# Patient Record
Sex: Male | Born: 1985 | Race: White | Hispanic: No | Marital: Married | Smoking: Never smoker
Health system: Southern US, Community
[De-identification: ages and names within clinical notes are randomized; demographics above are authoritative.]

## PROBLEM LIST (undated history)

## (undated) DIAGNOSIS — F909 Attention-deficit hyperactivity disorder, unspecified type: Secondary | ICD-10-CM

## (undated) DIAGNOSIS — N529 Male erectile dysfunction, unspecified: Secondary | ICD-10-CM

## (undated) DIAGNOSIS — N200 Calculus of kidney: Secondary | ICD-10-CM

## (undated) HISTORY — DX: Male erectile dysfunction, unspecified: N52.9

## (undated) HISTORY — DX: Attention-deficit hyperactivity disorder, unspecified type: F90.9

## (undated) HISTORY — DX: Calculus of kidney: N20.0

## (undated) HISTORY — PX: OTHER SURGICAL HISTORY: SHX169

---

## 2019-06-08 ENCOUNTER — Other Ambulatory Visit: Payer: Self-pay

## 2019-06-08 ENCOUNTER — Ambulatory Visit: Payer: Managed Care, Other (non HMO) | Admitting: Urology

## 2019-06-08 ENCOUNTER — Encounter: Payer: Self-pay | Admitting: Urology

## 2019-06-08 VITALS — BP 157/104 | HR 120 | Ht 74.0 in | Wt 177.0 lb

## 2019-06-08 DIAGNOSIS — Z3009 Encounter for other general counseling and advice on contraception: Secondary | ICD-10-CM

## 2019-06-08 MED ORDER — DIAZEPAM 10 MG PO TABS: 10.0000 mg | ORAL_TABLET | Freq: Once | ORAL | 0 refills | Status: AC

## 2019-06-08 NOTE — Progress Notes (Signed)
06/08/2019 1:55 PM   Kenneth Rocha [birth year] [id number]  Referring provider: No referring provider defined for this encounter.  Chief Complaint  Patient presents with  . VAS Consult    HPI: 33 y.o. year old male referred for further evaluation of possible vasectomy.  He and his wife have one biological child.  His wife had severe post partum depression following the birth of their first child.  They both agree that vasectomy is the best options for family planning.  He denies a history of testicular trauma or pain.  No urinary issues.  No previous scrotal surgeries.   PMH: Past Medical History:  Diagnosis Date  . ADHD   . ED (erectile dysfunction)     Surgical History: Past Surgical History:  Procedure Laterality Date  . none      Home Medications:  Allergies as of 06/08/2019   No Known Allergies     Medication List       Accurate as of June 08, 2019  1:55 PM. If you have any questions, ask your nurse or doctor.        amphetamine-dextroamphetamine 20 MG tablet Commonly known as: ADDERALL Take 20 mg by mouth 2 (two) times daily.   amphetamine-dextroamphetamine 25 MG 24 hr capsule Commonly known as: ADDERALL XR Take by mouth daily.   diazepam 10 MG tablet Commonly known as: Valium Take 1 tablet (10 mg total) by mouth once for 1 dose. Take 1 hour prior to procedure. Please have a driver available. Started by: Vanna Scotland, MD   sildenafil 20 MG tablet Commonly known as: REVATIO PLEASE SEE ATTACHED FOR DETAILED DIRECTIONS       Allergies: No Known Allergies  Family History: Family History  Problem Relation Age of Onset  . Prostate cancer Neg Hx   . Kidney cancer Neg Hx     Social History:  reports that he has never smoked. He has never used smokeless tobacco. He reports current alcohol use. No history on file for drug.  ROS: UROLOGY Frequent Urination?: No Hard to postpone urination?: No Burning/pain with urination?: No Get up at  night to urinate?: No Leakage of urine?: No Urine stream starts and stops?: No Trouble starting stream?: No Do you have to strain to urinate?: No Blood in urine?: No Urinary tract infection?: No Sexually transmitted disease?: No Injury to kidneys or bladder?: No Painful intercourse?: No Weak stream?: No Erection problems?: Yes Penile pain?: No  Gastrointestinal Nausea?: No Vomiting?: No Indigestion/heartburn?: No Diarrhea?: No Constipation?: No  Constitutional Fever: No Night sweats?: No Weight loss?: No Fatigue?: No  Skin Skin rash/lesions?: No Itching?: No  Eyes Blurred vision?: No Double vision?: No  Ears/Nose/Throat Sore throat?: No Sinus problems?: No  Hematologic/Lymphatic Swollen glands?: No Easy bruising?: No  Cardiovascular Leg swelling?: No Chest pain?: No  Respiratory Cough?: No Shortness of breath?: No  Endocrine Excessive thirst?: No  Musculoskeletal Back pain?: No Joint pain?: No  Neurological Headaches?: No Dizziness?: No  Psychologic Depression?: No Anxiety?: No  Physical Exam: BP (!) 157/104   Pulse (!) 120   Ht 6\' 2"  (1.88 m)   Wt 177 lb (80.3 kg)   BMI 22.73 kg/m   Constitutional:  Alert and oriented, No acute distress. HEENT: [place] AT, moist mucus membranes.  Trachea midline, no masses. Cardiovascular: No clubbing, cyanosis, or edema. Respiratory: Normal respiratory effort, no increased work of breathing. GI: Abdomen is soft, nontender, nondistended, no abdominal masses GU: Normal phallus.  Bilateral descended testicles without masses.  Vasa easily palpable bilaterally. Skin: No rashes, bruises or suspicious lesions. Neurologic: Grossly intact, no focal deficits, moving all 4 extremities. Psychiatric: Normal mood and affect.  Laboratory Data: N/a  Urinalysis n/a  Pertinent Imaging: N/a  Assessment & Plan:    1. Vasectomy evaluation Today, we discussed what the vas deferens is, where it is located, and its  function. We reviewed the procedure for vasectomy, it's risks, benefits, alternatives, and likelihood of achieving his goals. We discussed in detail the procedure, complications, and recovery as well as the need for clearance prior to unprotected intercourse. We discussed that vasectomy does not protect against sexually transmitted diseases. We discussed that this procedure does not result in immediate sterility and that they would need to use other forms of birth control until he has been cleared with negative postvasectomy semen analyses. I explained that the procedure is considered to be permanent and that attempts at reversal have varying degrees of success. These options include vasectomy reversal, sperm retrieval, and in vitro fertilization; these can be very expensive. We discussed the chance of postvasectomy pain syndrome which occurs in less than 5% of patients. I explained to the patient that there is no treatment to resolve this chronic pain, and that if it developed I would not be able to help resolve the issue, but that surgery is generally not needed for correction. I explained there have even been reports of systemic like illness associated with this chronic pain, and that there was no good cure. I explained that vasectomy it is not a 100% reliable form of birth control, and the risk of pregnancy after vasectomy is approximately 1 in 2000 men who had a negative postvasectomy semen analysis or rare non-motile sperm. I explained that repeat vasectomy was necessary in less than 1% of vasectomy procedures when employing the type of technique that I use. I explained that he should refrain from ejaculation for approximately one week following vasectomy. I explained that there are other options for birth control which are permanent and non-permanent; we discussed these. I explained the rates of surgical complications, such as symptomatic hematoma or infection, are low (1-2%) and vary with the surgeon's  experience and criteria used to diagnose the complication.   The patient had the opportunity to ask questions to his stated satisfaction. He voiced understanding of the above factors and stated that he has read all the information provided to him and the packets and informed consent.  He is interested in receiving of Valium 10 mg prior to the procedure for the purpose of anxiolysis.  A prescription was given today.  He will have a driver on the day of the procedure.   Hollice Espy, MD  [facility] [address] [place], [place] [address] [phone]

## 2019-06-08 NOTE — Patient Instructions (Signed)

## 2019-07-07 ENCOUNTER — Ambulatory Visit: Payer: Managed Care, Other (non HMO) | Admitting: Urology

## 2019-07-07 ENCOUNTER — Encounter: Payer: Self-pay | Admitting: Urology

## 2019-07-07 ENCOUNTER — Other Ambulatory Visit: Payer: Self-pay

## 2019-07-07 ENCOUNTER — Other Ambulatory Visit: Payer: Self-pay | Admitting: Urology

## 2019-07-07 VITALS — BP 150/80 | HR 106 | Ht 69.0 in | Wt 174.0 lb

## 2019-07-07 DIAGNOSIS — Z302 Encounter for sterilization: Secondary | ICD-10-CM | POA: Diagnosis not present

## 2019-07-07 MED ORDER — HYDROCODONE-ACETAMINOPHEN 5-325 MG PO TABS: 1.0000 | ORAL_TABLET | ORAL | 0 refills | Status: DC | PRN

## 2019-07-07 NOTE — Patient Instructions (Signed)

## 2019-07-07 NOTE — Progress Notes (Signed)
Vasectomy Procedure Note  Indications: The patient is a 33 y.o. male who presents today for elective sterilization.  He has been consented for the procedure.  He is aware of the risks and benefits.  He had no additional questions.  He agrees to proceed.  He denies any other significant change since his last visit.  Pre-operative Diagnosis: Elective sterilization  Post-operative Diagnosis: Elective sterilization  Premedication: Valium 10 mg po  Surgeon: Scott C. Stoioff, M.D  Description: The patient was prepped and draped in the standard fashion.  The right vas deferens was identified and brought superiorly to the anterior scrotal skin.  The skin and vas was then anesthetized utilizing 5 ml 1% lidocaine.  A small stab incision was made and spread with the vas dissector.  The vas was grasped utilizing the vas clamp and elevated out of the incision.  The vas was dissected free from surrounding tissue and vessels and an ~1 cm segment was excised.  The vas lumens were cauterized utilizing electrocautery.  The distal segment was buried in the surrounding sheath with a 3-0 chromic suture.  No significant bleeding was observed.  The vas ends were then dropped back into the hemiscrotum.  The skin was closed with hemostatic pressure.  An identical procedure was performed on the contralateral side.  Clean dry gauze was applied to the incision sites.  The patient tolerated the procedure well.  Complications:None  Recommendations: 1.  No lifting greater than 10 pounds or strenuousactivity for 1 week. 2.  Scrotal support for 1 week. 3.  Shower only for 1 week; may shower in the morning 4.  May resume intercourse in one week if no significant discomfort.  Continue alternate contraception for 12 weeks.  5.  Call for significant pain, swelling, redness, drainage or fever greater than 100.5. 6.  Rx hydrocodone/APAP 5/325 1-2 every 6 hours as needed for pain. 7.  Follow-up semen analysis in 12  weeks.   Scott Stoioff, MD  

## 2019-07-08 ENCOUNTER — Encounter: Payer: Self-pay | Admitting: Urology

## 2019-07-11 MED ORDER — HYDROCODONE-ACETAMINOPHEN 5-325 MG PO TABS: 1.0000 | ORAL_TABLET | ORAL | 0 refills | Status: DC | PRN

## 2019-09-11 ENCOUNTER — Encounter: Payer: Self-pay | Admitting: Urology

## 2019-09-13 NOTE — Telephone Encounter (Signed)
Called patient to discuss symptoms. Patient states pain has improved, concerned about the vein swelling. Offered a same day appointment, declined first available for his schedule is 09/15/19. Patient aware if symptoms worsen, seek medical attention. Redness, swelling, pain. Patient voiced understanding.

## 2019-09-15 ENCOUNTER — Ambulatory Visit (INDEPENDENT_AMBULATORY_CARE_PROVIDER_SITE_OTHER): Payer: Managed Care, Other (non HMO) | Admitting: Physician Assistant

## 2019-09-15 ENCOUNTER — Encounter: Payer: Self-pay | Admitting: Physician Assistant

## 2019-09-15 ENCOUNTER — Other Ambulatory Visit: Payer: Self-pay

## 2019-09-15 VITALS — BP 146/102 | HR 84 | Ht 69.0 in | Wt 184.0 lb

## 2019-09-15 DIAGNOSIS — I808 Phlebitis and thrombophlebitis of other sites: Secondary | ICD-10-CM

## 2019-09-15 NOTE — Progress Notes (Signed)
09/15/2019 2:38 PM   Kenneth Rocha 11/27/1985 [id number]  CC: Penile induration  HPI: Kenneth Rocha is a 34 y.o. male who presents today for evaluation of penile induration.  He underwent vasectomy with Dr. Bernardo Heater on 07/07/2019 and takes sildenafil for ED.  Today, he reports an approximate 10-day history of a "hardened vein" spanning the length of his penis.  He originally noted an indurated region approximately 2 cm in diameter at the base of his penis that progressively spread distally to the coronal ridge and followed a presumed vascular pattern.    The induration persists regardless of tumescence.  It is mildly tender to palpation; tenderness is described as stinging and 3/10 in severity.  Nothing like this is ever happened to him before.  He denies a history of coagulopathy.  He has not taken any medication at home for symptom management.  He denies fever, chills, nausea, vomiting, penile discharge, dysuria, and gross hematuria.  He notes that this acute change has not affected the quality of his erections.  He denies painful erections or penile curvature.  PMH: Past Medical History:  Diagnosis Date  . ADHD   . ED (erectile dysfunction)     Surgical History: Past Surgical History:  Procedure Laterality Date  . none      Home Medications:  Allergies as of 09/15/2019      Reactions   Sulfa Antibiotics Hives      Medication List       Accurate as of September 15, 2019  2:38 PM. If you have any questions, ask your nurse or doctor.        STOP taking these medications   HYDROcodone-acetaminophen 5-325 MG tablet Commonly known as: NORCO/VICODIN Stopped by: Debroah Loop, PA-C   sildenafil 20 MG tablet Commonly known as: REVATIO Stopped by: Debroah Loop, PA-C     TAKE these medications   amphetamine-dextroamphetamine 20 MG tablet Commonly known as: ADDERALL Take 20 mg by mouth daily. What changed: Another medication with the same name was  removed. Continue taking this medication, and follow the directions you see here. Changed by: Debroah Loop, PA-C   sildenafil 50 MG tablet Commonly known as: VIAGRA Take 50 mg by mouth daily as needed for erectile dysfunction.       Allergies:  Allergies  Allergen Reactions  . Sulfa Antibiotics Hives    Family History: Family History  Problem Relation Age of Onset  . Prostate cancer Neg Hx   . Kidney cancer Neg Hx     Social History:   reports that he has never smoked. He has never used smokeless tobacco. He reports current alcohol use. No history on file for drug.  Physical Exam: BP (!) 146/102   Pulse 84   Ht 5\' 9"  (1.753 m)   Wt 184 lb (83.5 kg)   BMI 27.17 kg/m   Constitutional:  Alert and oriented, no acute distress, nontoxic appearing HEENT: [place], AT Cardiovascular: No clubbing, cyanosis, or edema Respiratory: Normal respiratory effort, no increased work of breathing GU: Bilateral descended testicles.  Palpable, discrete vascular induration spanning the right ventral aspect of the penis from the base to the mid shaft without erythema or edema.  No penile discharge. 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  Recommend supportive care with ibuprofen 400 mg every 6 to 8 hours as needed.  Explained to the patient that it may take time for the blood clot to resolve and that some degree of induration may be permanent.  Counseled patient to contact our office if he develops acute onset redness, swelling, or pain.  He expressed understanding.  Return if symptoms worsen or fail to improve.  Carman Ching, PA-C  [facility] [address], [address] [place], [place] [address] [phone]

## 2019-09-15 NOTE — Patient Instructions (Addendum)
Take ibuprofen 400mg  every 6-8 hours as needed for symptom palliation. Contact our office if you develop redness, swelling, or pain.

## 2019-09-27 ENCOUNTER — Other Ambulatory Visit: Payer: Self-pay | Admitting: Physician Assistant

## 2019-09-27 DIAGNOSIS — R809 Proteinuria, unspecified: Secondary | ICD-10-CM

## 2019-10-05 ENCOUNTER — Other Ambulatory Visit: Payer: Self-pay

## 2019-10-05 ENCOUNTER — Other Ambulatory Visit: Payer: Managed Care, Other (non HMO)

## 2019-10-05 DIAGNOSIS — Z302 Encounter for sterilization: Secondary | ICD-10-CM

## 2019-10-06 LAB — POST-VAS SPERM EVALUATION,QUAL: Volume: 3 mL

## 2019-10-07 ENCOUNTER — Telehealth: Payer: Self-pay | Admitting: *Deleted

## 2019-10-07 NOTE — Telephone Encounter (Signed)
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  Okay to use vasectomy as primary contraception

## 2019-10-07 NOTE — Telephone Encounter (Signed)
Patient notified on voicemail per [facility] and mychart notification sent

## 2020-08-09 ENCOUNTER — Ambulatory Visit: Payer: Managed Care, Other (non HMO) | Admitting: Urology

## 2020-08-09 ENCOUNTER — Other Ambulatory Visit: Payer: Self-pay | Admitting: *Deleted

## 2020-08-09 ENCOUNTER — Ambulatory Visit
Admission: RE | Admit: 2020-08-09 | Discharge: 2020-08-09 | Disposition: A | Discharge status: Home / Self Care | Payer: Managed Care, Other (non HMO) | Source: Ambulatory Visit | Attending: Urology | Admitting: Urology

## 2020-08-09 ENCOUNTER — Other Ambulatory Visit: Payer: Self-pay

## 2020-08-09 ENCOUNTER — Encounter: Payer: Self-pay | Admitting: Urology

## 2020-08-09 ENCOUNTER — Ambulatory Visit
Admission: RE | Admit: 2020-08-09 | Discharge: 2020-08-09 | Disposition: A | Discharge status: Home / Self Care | Payer: Managed Care, Other (non HMO) | Attending: Urology | Admitting: Urology

## 2020-08-09 VITALS — BP 137/95 | HR 99 | Ht 74.0 in | Wt 180.0 lb

## 2020-08-09 DIAGNOSIS — R1032 Left lower quadrant pain: Secondary | ICD-10-CM

## 2020-08-09 DIAGNOSIS — N200 Calculus of kidney: Secondary | ICD-10-CM

## 2020-08-09 DIAGNOSIS — N132 Hydronephrosis with renal and ureteral calculous obstruction: Secondary | ICD-10-CM | POA: Diagnosis not present

## 2020-08-09 DIAGNOSIS — N23 Unspecified renal colic: Secondary | ICD-10-CM | POA: Diagnosis not present

## 2020-08-09 DIAGNOSIS — N201 Calculus of ureter: Secondary | ICD-10-CM

## 2020-08-09 LAB — MICROSCOPIC EXAMINATION
Bacteria, UA: NONE SEEN
Epithelial Cells (non renal): NONE SEEN /hpf (ref 0–10)

## 2020-08-09 LAB — URINALYSIS, COMPLETE
Bilirubin, UA: NEGATIVE
Glucose, UA: NEGATIVE
Ketones, UA: NEGATIVE
Leukocytes,UA: NEGATIVE
Nitrite, UA: NEGATIVE
Protein,UA: NEGATIVE
Specific Gravity, UA: 1.01 (ref 1.005–1.030)
Urobilinogen, Ur: 0.2 mg/dL (ref 0.2–1.0)
pH, UA: 6.5 (ref 5.0–7.5)

## 2020-08-09 NOTE — H&P (View-Only) (Signed)
08/09/2020 10:38 AM   Kenneth Rocha 1986/05/05 [id number]  Referring provider: No referring provider defined for this encounter.  Chief Complaint  Patient presents with  . Nephrolithiasis    HPI: 35 y.o. male presents for urgent visit for a possible kidney stone.   2-3 week history low back pain worse the last 3-4 days  Last night after heavy glass of wine he had onset of severe left upper quadrant pain which radiated to the left groin region  Associated with nausea/vomiting  Denies fever/chills  Pain much better this morning but located in the left flank and radiating to the left lower quadrant, left upper quadrant and groin region  No bothersome LUTS  Denies gross hematuria  Prior stone ~2012 which she passed  KUB performed this morning was reviewed; large calcifications between L4-5 transverse processes on the left.  Multiple pelvic phleboliths   PMH: Past Medical History:  Diagnosis Date  . ADHD   . ED (erectile dysfunction)     Surgical History: Past Surgical History:  Procedure Laterality Date  . none      Home Medications:  Allergies as of 08/09/2020      Reactions   Sulfa Antibiotics Hives      Medication List       Accurate as of August 09, 2020 10:38 AM. If you have any questions, ask your nurse or doctor.        amphetamine-dextroamphetamine 20 MG tablet Commonly known as: ADDERALL Take 20 mg by mouth daily.   sildenafil 50 MG tablet Commonly known as: VIAGRA Take 50 mg by mouth daily as needed for erectile dysfunction.       Allergies:  Allergies  Allergen Reactions  . Sulfa Antibiotics Hives    Family History: Family History  Problem Relation Age of Onset  . Prostate cancer Neg Hx   . Kidney cancer Neg Hx     Social History:  reports that he has never smoked. He has never used smokeless tobacco. He reports current alcohol use. No history on file for drug use.   Physical Exam: There were no vitals taken for this  visit.  Constitutional:  Alert and oriented, No acute distress. HEENT:  AT, moist mucus membranes.  Trachea midline, no masses. Cardiovascular: No clubbing, cyanosis, or edema. Respiratory: Normal respiratory effort, no increased work of breathing. Skin: No rashes, bruises or suspicious lesions. Neurologic: Grossly intact, no focal deficits, moving all 4 extremities. Psychiatric: Normal mood and affect.  Laboratory Data:   Urinalysis Micro 3-10 RBCs/0 WBC   Assessment & Plan:    1.  Left flank/abdominal pain  Pain severe last night and most likely secondary to ureteral calculus  Stone protocol CT ordered  He will be notified with results and discussion of treatment options  Addendum: I contacted patient this morning regarding his CT imaging.  He does have two adjacent calcifications measuring 5 x 7 mm.  Variable HU between [id number].  Mild to moderate left hydronephrosis with upstream hydroureter  I discussed unlikely he will pass these calculi and do not feel a trial of passage would be beneficial.  Both SWL and ureteroscopy were discussed as options.  SWL has a lower stone free rate in a single procedure, but also a lower complication rate compared to ureteroscopy and avoids a stent and associated stent related symptoms. Possible complications include incomplete fragmentation, obstructing stone fragments, and need for additional treatment.  Ureteroscopy with laser lithotripsy and stent placement has a higher stone free rate than  SWL in a single procedure, however increased complication rate including possible infection, ureteral injury, bleeding, and stent related morbidity. Common stent related symptoms include dysuria, urgency/frequency, and flank pain.  His pain has been intermittently severe and he was concerned about continued pain while trying to pass stone fragments and has opted for ureteroscopy.  We discussed availability with Dr. Richardo Hanks tomorrow and Dr. Apolinar Junes on  Monday and he requested to be scheduled Monday.  Rx oxycodone/APAP sent to pharmacy    Riki Altes, MD  [facility] [address] [place], [place] [address] [phone]

## 2020-08-09 NOTE — Progress Notes (Signed)
08/09/2020 10:38 AM   Kenneth Rocha 1986/05/05 [id number]  Referring provider: No referring provider defined for this encounter.  Chief Complaint  Patient presents with  . Nephrolithiasis    HPI: 35 y.o. male presents for urgent visit for a possible kidney stone.   2-3 week history low back pain worse the last 3-4 days  Last night after heavy glass of wine he had onset of severe left upper quadrant pain which radiated to the left groin region  Associated with nausea/vomiting  Denies fever/chills  Pain much better this morning but located in the left flank and radiating to the left lower quadrant, left upper quadrant and groin region  No bothersome LUTS  Denies gross hematuria  Prior stone ~2012 which she passed  KUB performed this morning was reviewed; large calcifications between L4-5 transverse processes on the left.  Multiple pelvic phleboliths   PMH: Past Medical History:  Diagnosis Date  . ADHD   . ED (erectile dysfunction)     Surgical History: Past Surgical History:  Procedure Laterality Date  . none      Home Medications:  Allergies as of 08/09/2020      Reactions   Sulfa Antibiotics Hives      Medication List       Accurate as of August 09, 2020 10:38 AM. If you have any questions, ask your nurse or doctor.        amphetamine-dextroamphetamine 20 MG tablet Commonly known as: ADDERALL Take 20 mg by mouth daily.   sildenafil 50 MG tablet Commonly known as: VIAGRA Take 50 mg by mouth daily as needed for erectile dysfunction.       Allergies:  Allergies  Allergen Reactions  . Sulfa Antibiotics Hives    Family History: Family History  Problem Relation Age of Onset  . Prostate cancer Neg Hx   . Kidney cancer Neg Hx     Social History:  reports that he has never smoked. He has never used smokeless tobacco. He reports current alcohol use. No history on file for drug use.   Physical Exam: There were no vitals taken for this  visit.  Constitutional:  Alert and oriented, No acute distress. HEENT:  AT, moist mucus membranes.  Trachea midline, no masses. Cardiovascular: No clubbing, cyanosis, or edema. Respiratory: Normal respiratory effort, no increased work of breathing. Skin: No rashes, bruises or suspicious lesions. Neurologic: Grossly intact, no focal deficits, moving all 4 extremities. Psychiatric: Normal mood and affect.  Laboratory Data:   Urinalysis Micro 3-10 RBCs/0 WBC   Assessment & Plan:    1.  Left flank/abdominal pain  Pain severe last night and most likely secondary to ureteral calculus  Stone protocol CT ordered  He will be notified with results and discussion of treatment options  Addendum: I contacted patient this morning regarding his CT imaging.  He does have two adjacent calcifications measuring 5 x 7 mm.  Variable HU between [id number].  Mild to moderate left hydronephrosis with upstream hydroureter  I discussed unlikely he will pass these calculi and do not feel a trial of passage would be beneficial.  Both SWL and ureteroscopy were discussed as options.  SWL has a lower stone free rate in a single procedure, but also a lower complication rate compared to ureteroscopy and avoids a stent and associated stent related symptoms. Possible complications include incomplete fragmentation, obstructing stone fragments, and need for additional treatment.  Ureteroscopy with laser lithotripsy and stent placement has a higher stone free rate than  SWL in a single procedure, however increased complication rate including possible infection, ureteral injury, bleeding, and stent related morbidity. Common stent related symptoms include dysuria, urgency/frequency, and flank pain.  His pain has been intermittently severe and he was concerned about continued pain while trying to pass stone fragments and has opted for ureteroscopy.  We discussed availability with Dr. Sninsky tomorrow and Dr. Brandon on  Monday and he requested to be scheduled Monday.  Rx oxycodone/APAP sent to pharmacy    Tyna Huertas C Maher Shon, MD  [facility] [address], [address] , [place] [address] [phone]  

## 2020-08-10 ENCOUNTER — Encounter: Payer: Self-pay | Admitting: Urology

## 2020-08-10 MED ORDER — OXYCODONE-ACETAMINOPHEN 5-325 MG PO TABS: 1.0000 | ORAL_TABLET | Freq: Four times a day (QID) | ORAL | 0 refills | Status: DC | PRN

## 2020-08-11 ENCOUNTER — Other Ambulatory Visit: Payer: Self-pay

## 2020-08-11 ENCOUNTER — Other Ambulatory Visit: Payer: Managed Care, Other (non HMO) | Admitting: Radiology

## 2020-08-11 ENCOUNTER — Other Ambulatory Visit
Admission: RE | Admit: 2020-08-11 | Discharge: 2020-08-11 | Disposition: A | Discharge status: Home / Self Care | Payer: Managed Care, Other (non HMO) | Source: Ambulatory Visit | Attending: Urology | Admitting: Urology

## 2020-08-11 DIAGNOSIS — Z20822 Contact with and (suspected) exposure to covid-19: Secondary | ICD-10-CM | POA: Insufficient documentation

## 2020-08-11 DIAGNOSIS — Z01812 Encounter for preprocedural laboratory examination: Secondary | ICD-10-CM | POA: Diagnosis not present

## 2020-08-11 DIAGNOSIS — N201 Calculus of ureter: Secondary | ICD-10-CM

## 2020-08-12 LAB — SARS CORONAVIRUS 2 (TAT 6-24 HRS): SARS Coronavirus 2: NEGATIVE

## 2020-08-14 ENCOUNTER — Ambulatory Visit: Payer: Managed Care, Other (non HMO) | Admitting: Anesthesiology

## 2020-08-14 ENCOUNTER — Encounter
Admission: RE | Disposition: A | Discharge status: Home / Self Care | Payer: Self-pay | Source: Home / Self Care | Attending: Urology

## 2020-08-14 ENCOUNTER — Ambulatory Visit: Payer: Managed Care, Other (non HMO)

## 2020-08-14 ENCOUNTER — Other Ambulatory Visit: Payer: Self-pay

## 2020-08-14 ENCOUNTER — Ambulatory Visit
Admission: RE | Admit: 2020-08-14 | Discharge: 2020-08-14 | Disposition: A | Discharge status: Home / Self Care | Payer: Managed Care, Other (non HMO) | Attending: Urology | Admitting: Urology

## 2020-08-14 ENCOUNTER — Encounter: Payer: Self-pay | Admitting: Urology

## 2020-08-14 DIAGNOSIS — N201 Calculus of ureter: Secondary | ICD-10-CM | POA: Diagnosis not present

## 2020-08-14 DIAGNOSIS — Z882 Allergy status to sulfonamides status: Secondary | ICD-10-CM | POA: Insufficient documentation

## 2020-08-14 DIAGNOSIS — N132 Hydronephrosis with renal and ureteral calculous obstruction: Secondary | ICD-10-CM | POA: Insufficient documentation

## 2020-08-14 DIAGNOSIS — Z79899 Other long term (current) drug therapy: Secondary | ICD-10-CM | POA: Insufficient documentation

## 2020-08-14 HISTORY — PX: CYSTOSCOPY/URETEROSCOPY/HOLMIUM LASER/STENT PLACEMENT: SHX6546

## 2020-08-14 SURGERY — CYSTOSCOPY/URETEROSCOPY/HOLMIUM LASER/STENT PLACEMENT
Anesthesia: General | Site: Ureter | Laterality: Left

## 2020-08-14 MED ORDER — PROPOFOL 10 MG/ML IV BOLUS
INTRAVENOUS | Status: AC
  Filled 2020-08-14: qty 40

## 2020-08-14 MED ORDER — ACETAMINOPHEN 10 MG/ML IV SOLN
INTRAVENOUS | Status: DC | PRN
  Administered 2020-08-14: 1000 mg via INTRAVENOUS

## 2020-08-14 MED ORDER — IOPAMIDOL (ISOVUE-200) INJECTION 41%
INTRAVENOUS | Status: DC | PRN
  Administered 2020-08-14: 15 mL

## 2020-08-14 MED ORDER — FENTANYL CITRATE (PF) 100 MCG/2ML IJ SOLN
INTRAMUSCULAR | Status: DC | PRN
  Administered 2020-08-14 (×2): 50 ug via INTRAVENOUS

## 2020-08-14 MED ORDER — FENTANYL CITRATE (PF) 100 MCG/2ML IJ SOLN
INTRAMUSCULAR | Status: AC
  Filled 2020-08-14: qty 2

## 2020-08-14 MED ORDER — CHLORHEXIDINE GLUCONATE 0.12 % MT SOLN: 15.0000 mL | Freq: Once | OROMUCOSAL | Status: AC

## 2020-08-14 MED ORDER — ONDANSETRON HCL 4 MG/2ML IJ SOLN
INTRAMUSCULAR | Status: DC | PRN
  Administered 2020-08-14: 4 mg via INTRAVENOUS

## 2020-08-14 MED ORDER — ONDANSETRON HCL 4 MG/2ML IJ SOLN
INTRAMUSCULAR | Status: AC
  Filled 2020-08-14: qty 2

## 2020-08-14 MED ORDER — MIDAZOLAM HCL 2 MG/2ML IJ SOLN
INTRAMUSCULAR | Status: DC | PRN
  Administered 2020-08-14: 2 mg via INTRAVENOUS

## 2020-08-14 MED ORDER — LIDOCAINE HCL (CARDIAC) PF 100 MG/5ML IV SOSY
PREFILLED_SYRINGE | INTRAVENOUS | Status: DC | PRN
  Administered 2020-08-14: 80 mg via INTRAVENOUS

## 2020-08-14 MED ORDER — LACTATED RINGERS IV SOLN: INTRAVENOUS | Status: DC

## 2020-08-14 MED ORDER — TAMSULOSIN HCL 0.4 MG PO CAPS: 0.4000 mg | ORAL_CAPSULE | Freq: Every day | ORAL | 0 refills | Status: DC

## 2020-08-14 MED ORDER — ACETAMINOPHEN 10 MG/ML IV SOLN
INTRAVENOUS | Status: AC
  Filled 2020-08-14: qty 100

## 2020-08-14 MED ORDER — ONDANSETRON HCL 4 MG/2ML IJ SOLN
4.0000 mg | Freq: Once | INTRAMUSCULAR | Status: AC | PRN
  Administered 2020-08-14: 4 mg via INTRAVENOUS

## 2020-08-14 MED ORDER — CEFAZOLIN SODIUM-DEXTROSE 2-4 GM/100ML-% IV SOLN
INTRAVENOUS | Status: AC
  Filled 2020-08-14: qty 100

## 2020-08-14 MED ORDER — FENTANYL CITRATE (PF) 100 MCG/2ML IJ SOLN
INTRAMUSCULAR | Status: AC
  Administered 2020-08-14: 25 ug via INTRAVENOUS
  Filled 2020-08-14: qty 2

## 2020-08-14 MED ORDER — CEFAZOLIN SODIUM-DEXTROSE 2-4 GM/100ML-% IV SOLN
2.0000 g | INTRAVENOUS | Status: AC
  Administered 2020-08-14: 2 g via INTRAVENOUS

## 2020-08-14 MED ORDER — ACETAMINOPHEN 10 MG/ML IV SOLN: 1000.0000 mg | Freq: Once | INTRAVENOUS | Status: DC | PRN

## 2020-08-14 MED ORDER — FAMOTIDINE 20 MG PO TABS
ORAL_TABLET | ORAL | Status: AC
  Administered 2020-08-14: 20 mg
  Filled 2020-08-14: qty 1

## 2020-08-14 MED ORDER — PROPOFOL 10 MG/ML IV BOLUS
INTRAVENOUS | Status: DC | PRN
  Administered 2020-08-14: 200 mg via INTRAVENOUS

## 2020-08-14 MED ORDER — ORAL CARE MOUTH RINSE: 15.0000 mL | Freq: Once | OROMUCOSAL | Status: AC

## 2020-08-14 MED ORDER — OXYCODONE HCL 5 MG PO TABS
ORAL_TABLET | ORAL | Status: AC
  Administered 2020-08-14: 5 mg via ORAL
  Filled 2020-08-14: qty 1

## 2020-08-14 MED ORDER — OXYCODONE HCL 5 MG/5ML PO SOLN: 5.0000 mg | Freq: Once | ORAL | Status: AC | PRN

## 2020-08-14 MED ORDER — DEXAMETHASONE SODIUM PHOSPHATE 10 MG/ML IJ SOLN
INTRAMUSCULAR | Status: DC | PRN
  Administered 2020-08-14: 5 mg via INTRAVENOUS

## 2020-08-14 MED ORDER — FENTANYL CITRATE (PF) 100 MCG/2ML IJ SOLN
25.0000 ug | INTRAMUSCULAR | Status: AC | PRN
  Administered 2020-08-14 (×4): 25 ug via INTRAVENOUS

## 2020-08-14 MED ORDER — OXYCODONE HCL 5 MG PO TABS: 5.0000 mg | ORAL_TABLET | Freq: Once | ORAL | Status: AC | PRN

## 2020-08-14 MED ORDER — OXYBUTYNIN CHLORIDE 5 MG PO TABS: 5.0000 mg | ORAL_TABLET | Freq: Three times a day (TID) | ORAL | 0 refills | Status: DC | PRN

## 2020-08-14 MED ORDER — CHLORHEXIDINE GLUCONATE 0.12 % MT SOLN
OROMUCOSAL | Status: AC
  Administered 2020-08-14: 15 mL via OROMUCOSAL
  Filled 2020-08-14: qty 15

## 2020-08-14 MED ORDER — MIDAZOLAM HCL 2 MG/2ML IJ SOLN
INTRAMUSCULAR | Status: AC
  Filled 2020-08-14: qty 2

## 2020-08-14 SURGICAL SUPPLY — 33 items
ADH LQ OCL WTPRF AMP STRL LF (MISCELLANEOUS) ×1
ADHESIVE MASTISOL STRL (MISCELLANEOUS) ×2 IMPLANT
BAG DRAIN CYSTO-URO LG1000N (MISCELLANEOUS) ×2 IMPLANT
BASKET ZERO TIP 1.9FR (BASKET) ×2 IMPLANT
BRUSH SCRUB EZ 1% IODOPHOR (MISCELLANEOUS) ×2 IMPLANT
BSKT STON RTRVL ZERO TP 1.9FR (BASKET) ×1
CATH URETL 5X70 OPEN END (CATHETERS) ×2 IMPLANT
CNTNR SPEC 2.5X3XGRAD LEK (MISCELLANEOUS)
CONT SPEC 4OZ STER OR WHT (MISCELLANEOUS)
CONT SPEC 4OZ STRL OR WHT (MISCELLANEOUS)
CONTAINER SPEC 2.5X3XGRAD LEK (MISCELLANEOUS) IMPLANT
DRAPE UTILITY 15X26 TOWEL STRL (DRAPES) ×2 IMPLANT
DRSG TEGADERM 2-3/8X2-3/4 SM (GAUZE/BANDAGES/DRESSINGS) ×2 IMPLANT
GLOVE SURG ENC MOIS LTX SZ6.5 (GLOVE) ×2 IMPLANT
GOWN STRL REUS W/ TWL LRG LVL3 (GOWN DISPOSABLE) ×2 IMPLANT
GOWN STRL REUS W/TWL LRG LVL3 (GOWN DISPOSABLE) ×4
GUIDEWIRE GREEN .038 145CM (MISCELLANEOUS) IMPLANT
GUIDEWIRE STR DUAL SENSOR (WIRE) ×4 IMPLANT
INFUSOR MANOMETER BAG 3000ML (MISCELLANEOUS) ×2 IMPLANT
INTRODUCER DILATOR DOUBLE (INTRODUCER) IMPLANT
KIT TURNOVER CYSTO (KITS) ×2 IMPLANT
MANIFOLD NEPTUNE II (INSTRUMENTS) ×2 IMPLANT
PACK CYSTO AR (MISCELLANEOUS) ×2 IMPLANT
SET CYSTO W/LG BORE CLAMP LF (SET/KITS/TRAYS/PACK) ×2 IMPLANT
SHEATH URETERAL 12FRX35CM (MISCELLANEOUS) IMPLANT
SOL .9 NS 3000ML IRR  AL (IV SOLUTION) ×1
SOL .9 NS 3000ML IRR AL (IV SOLUTION) ×1
SOL .9 NS 3000ML IRR UROMATIC (IV SOLUTION) ×1 IMPLANT
STENT URET 6FRX24 CONTOUR (STENTS) IMPLANT
STENT URET 6FRX26 CONTOUR (STENTS) ×2 IMPLANT
SURGILUBE 2OZ TUBE FLIPTOP (MISCELLANEOUS) ×2 IMPLANT
TRACTIP FLEXIVA PULSE ID 200 (Laser) ×2 IMPLANT
WATER STERILE IRR 1000ML POUR (IV SOLUTION) ×2 IMPLANT

## 2020-08-14 NOTE — Anesthesia Procedure Notes (Signed)
Procedure Name: LMA Insertion Date/Time: 08/14/2020 2:34 PM Performed by: Clyde Lundborg, CRNA Pre-anesthesia Checklist: Patient identified, Emergency Drugs available, Suction available and Patient being monitored Patient Re-evaluated:Patient Re-evaluated prior to induction Oxygen Delivery Method: Circle system utilized Preoxygenation: Pre-oxygenation with 100% oxygen Induction Type: IV induction Ventilation: Mask ventilation without difficulty LMA: LMA inserted LMA Size: 4.0 Number of attempts: 1 Placement Confirmation: positive ETCO2,  breath sounds checked- equal and bilateral and CO2 detector Tube secured with: Tape Dental Injury: Teeth and Oropharynx as per pre-operative assessment

## 2020-08-14 NOTE — Anesthesia Preprocedure Evaluation (Signed)
Anesthesia Evaluation  Patient identified by MRN, date of birth, ID band Patient awake    Reviewed: Allergy & Precautions, NPO status , Patient's Chart, lab work & pertinent test results  History of Anesthesia Complications Negative for: history of anesthetic complications  Airway Mallampati: II  TM Distance: >3 FB Neck ROM: Full    Dental no notable dental hx. (+) Teeth Intact   Pulmonary neg pulmonary ROS, neg sleep apnea, neg COPD, Patient abstained from smoking.Not current smoker,    Pulmonary exam normal breath sounds clear to auscultation       Cardiovascular Exercise Tolerance: Good METS(-) hypertension(-) CAD and (-) Past MI negative cardio ROS  (-) dysrhythmias  Rhythm:Regular Rate:Normal - Systolic murmurs    Neuro/Psych PSYCHIATRIC DISORDERS negative neurological ROS     GI/Hepatic neg GERD  ,(+)     (-) substance abuse  ,   Endo/Other  neg diabetes  Renal/GU negative Renal ROS     Musculoskeletal   Abdominal   Peds  Hematology   Anesthesia Other Findings Past Medical History: No date: ADHD No date: ED (erectile dysfunction)  Reproductive/Obstetrics                            Anesthesia Physical Anesthesia Plan  ASA: II  Anesthesia Plan: General   Post-op Pain Management:    Induction: Intravenous  PONV Risk Score and Plan: 3 and Ondansetron and Dexamethasone  Airway Management Planned: Oral ETT and LMA  Additional Equipment: None  Intra-op Plan:   Post-operative Plan: Extubation in OR  Informed Consent: I have reviewed the patients History and Physical, chart, labs and discussed the procedure including the risks, benefits and alternatives for the proposed anesthesia with the patient or authorized representative who has indicated his/her understanding and acceptance.     Dental advisory given  Plan Discussed with: CRNA and Surgeon  Anesthesia Plan  Comments: (Discussed risks of anesthesia with patient, including PONV, sore throat, lip/dental damage. Rare risks discussed as well, such as cardiorespiratory and neurological sequelae. Patient understands.)        Anesthesia Quick Evaluation

## 2020-08-14 NOTE — Anesthesia Postprocedure Evaluation (Signed)
Anesthesia Post Note  Patient: Kenneth Rocha  Procedure(s) Performed: CYSTOSCOPY/URETEROSCOPY/HOLMIUM LASER/STENT PLACEMENT (Left Ureter)  Patient location during evaluation: PACU Anesthesia Type: General Level of consciousness: awake and alert Pain management: pain level controlled Vital Signs Assessment: post-procedure vital signs reviewed and stable Respiratory status: spontaneous breathing, nonlabored ventilation, respiratory function stable and patient connected to nasal cannula oxygen Cardiovascular status: blood pressure returned to baseline and stable Postop Assessment: no apparent nausea or vomiting Anesthetic complications: no   No complications documented.   Last Vitals:  Vitals:   08/14/20 1701 08/14/20 1735  BP: (!) 126/91 (!) 154/99  Pulse: 62 66  Resp: 18 16  Temp: (!) 36.3 C (!) 36.4 C  SpO2: 100% 98%    Last Pain:  Vitals:   08/14/20 1735  TempSrc: Temporal  PainSc: 3                  Lenard Simmer

## 2020-08-14 NOTE — Op Note (Signed)
Date of procedure: 08/14/20  Preoperative diagnosis:  1. Left mid ureteral calculus  Postoperative diagnosis:  1. Same as above  Procedure: 1. Left ureteroscopy with laser lithotripsy 2. Left retrograde pyelogram 3. Left ureteral stent placement 4. Basket extraction of stone fragment 5. Interpretation of fluoroscopy less than 30 minutes  Surgeon: Vanna Scotland, MD  Anesthesia: General  Complications: None  Intraoperative findings: Large every 7 mm stone in the mid ureter with some difficulty advancing the scope to this level.  Multiple fragments evacuated using 1.9 French tipless nitinol basket.  Some ureteral mucosal abrasions with manipulation, mild and superficial.  Unable to advance scope above level of the stone, minimal suspicion for retropulsed fragment.  EBL: Minimal  Specimens: Stone fragment  Drains: 6 x 26 French double-J ureteral stent on left  Indication: Kenneth Rocha is a 35 y.o. patient with at least 7 mm left mid ureteral calculus with hydroureteronephrosis to this level.  After reviewing the management options for treatment, he elected to proceed with the above surgical procedure(s). We have discussed the potential benefits and risks of the procedure, side effects of the proposed treatment, the likelihood of the patient achieving the goals of the procedure, and any potential problems that might occur during the procedure or recuperation. Informed consent has been obtained.  Description of procedure:  The patient was taken to the operating room and general anesthesia was induced.  The patient was placed in the dorsal lithotomy position, prepped and draped in the usual sterile fashion, and preoperative antibiotics were administered. A preoperative time-out was performed.   A 21 French scope was advanced per urethra into the bladder.  Attention was turned to the left ureteral orifice which was cannulated using an open-ended ureteral catheter.  A gentle retrograde  pyelogram was performed on the side which revealed a fairly decompressed ureter with a small defect in the mid ureter.  There is actually minimal hydroureteronephrosis to this level on today's retrograde.  The wire was then placed up easily to the level of the kidney.  This was used as a safety wire and stent in place.  A 4.5 semirigid ureteroscope was then advanced with some difficulty to level the stone.  Ended up introducing a second Super Stiff wire using a railroad technique to reach the stone.  I did manipulate the scope upside down as well as place the patient in Trendelenburg and laser the stone without irrigation in order to reach the stone and help try to prevent retropulsion of fragment.  I used a 243 m laser fiber with settings of 0.8 J and 10 Hz to fragment the stone into at least 20 or so smaller pieces.  I then used a 1.9 [place] tipless nitinol basket to extract each of the pieces.  Some of the pieces had to be further fragmented as there is quite narrowing at the level of the iliacs and where the stone was lodged.  Is unable to advance the scope much beyond the level of the mid ureter where the stone had been previously seen.  I did not see any fragments beyond this level and I tried positioning, limiting irrigation, and manual manipulation of his flank to help try to get some pieces down.  Given the tightness with just a 4.5 semirigid ureteroscope, did not feel the flexible ureteroscope would be able to advance.  Addition, there are some fairly friable mucosa along the length of the distal ureter with some small superficial abrasions from endoscopic manipulation and recommend extraction such  that I do not feel that this was safe.  Ultimately, once the entirety of the ureter was cleared, I did shoot 1 last left retrograde which showed no further filling defects and there was no obvious ureteral stones on plain fluoroscopy.  I backloaded the safety wire over rigid cystoscope and advanced a 6 x 26  French double-J ureteral stent up to the level of the kidney.  The wire was withdrawn and a full coil was noted both within the renal pelvis as well as within the bladder.  The bladder was then drained.  The patient was then cleaned and dried, repositioned in the supine position, reversed from anesthesia, and taken to the PACU in stable condition.  Plan: We will have him return next week with a KUB and if no additional stone fragments are appreciated, we will plan for stent removal in the office.  Vanna Scotland, M.D.

## 2020-08-14 NOTE — Transfer of Care (Signed)
Immediate Anesthesia Transfer of Care Note  Patient: Kenneth Rocha  Procedure(s) Performed: CYSTOSCOPY/URETEROSCOPY/HOLMIUM LASER/STENT PLACEMENT (Left Ureter)  Patient Location: PACU  Anesthesia Type:General  Level of Consciousness: awake, alert  and oriented  Airway & Oxygen Therapy: Patient Spontanous Breathing and Patient connected to face mask oxygen  Post-op Assessment: Report given to RN and Post -op Vital signs reviewed and stable  Post vital signs: Reviewed and stable  Last Vitals:  Vitals Value Taken Time  BP    Temp    Pulse    Resp    SpO2      Last Pain:  Vitals:   08/14/20 1108  TempSrc: Oral  PainSc: 0-No pain         Complications: No complications documented.

## 2020-08-14 NOTE — Discharge Instructions (Signed)
AMBULATORY SURGERY  DISCHARGE INSTRUCTIONS   1) The drugs that you were given will stay in your system until tomorrow so for the next 24 hours you should not:  A) Drive an automobile B) Make any legal decisions C) Drink any alcoholic beverage   2) You may resume regular meals tomorrow.  Today it is better to start with liquids and gradually work up to solid foods.  You may eat anything you prefer, but it is better to start with liquids, then soup and crackers, and gradually work up to solid foods.   3) Please notify your doctor immediately if you have any unusual bleeding, trouble breathing, redness and pain at the surgery site, drainage, fever, or pain not relieved by medication.    4) Additional Instructions:        Please contact your physician with any problems or Same Day Surgery at [phone], Monday through Friday 6 am to 4 pm, or [facility] Main number at [phone].You have a ureteral stent in place.  This is a tube that extends from your kidney to your bladder.  This may cause urinary bleeding, burning with urination, and urinary frequency.  Please call our office or present to the ED if you develop fevers >101 or pain which is not able to be controlled with oral pain medications.  You may be given either Flomax and/ or ditropan to help with bladder spasms and stent pain in addition to pain medications.    [facility] [address], [address] [place], [place] [address] [phone]  

## 2020-08-14 NOTE — Interval H&P Note (Signed)
History and Physical Interval Note:  08/14/2020 2:09 PM  Rykker Coviello  has presented today for surgery, with the diagnosis of left ureteral calculi.  The various methods of treatment have been discussed with the patient and family. After consideration of risks, benefits and other options for treatment, the patient has consented to  Procedure(s): CYSTOSCOPY/URETEROSCOPY/HOLMIUM LASER/STENT PLACEMENT (Left) as a surgical intervention.  The patient's history has been reviewed, patient examined, no change in status, stable for surgery.  I have reviewed the patient's chart and labs.  Questions were answered to the patient's satisfaction.    [facility]  Vanna Scotland

## 2020-08-15 ENCOUNTER — Encounter: Payer: Self-pay | Admitting: Urology

## 2020-08-15 ENCOUNTER — Other Ambulatory Visit: Payer: Self-pay | Admitting: Radiology

## 2020-08-15 ENCOUNTER — Telehealth: Payer: Self-pay | Admitting: *Deleted

## 2020-08-15 ENCOUNTER — Other Ambulatory Visit: Payer: Self-pay | Admitting: Urology

## 2020-08-15 DIAGNOSIS — N201 Calculus of ureter: Secondary | ICD-10-CM

## 2020-08-15 MED ORDER — OXYCODONE-ACETAMINOPHEN 5-325 MG PO TABS: 1.0000 | ORAL_TABLET | Freq: Four times a day (QID) | ORAL | 0 refills | Status: DC | PRN

## 2020-08-15 NOTE — Telephone Encounter (Signed)
My Chart message sent

## 2020-08-15 NOTE — Telephone Encounter (Signed)
Pt also called the triage phone, I have sent you a phone note as well.

## 2020-08-15 NOTE — Telephone Encounter (Signed)
Pt calling stating he had a stent placed yesterday and was asking about pain meds? Per pt he will run out of Percocet tomorrow and wanted to know Dr. Apolinar Junes recommendation on pain meds. I advised that Dr. Apolinar Junes sent in Oxybutynin and Flomax, please advise

## 2020-08-16 ENCOUNTER — Encounter: Payer: Self-pay | Admitting: Urology

## 2020-08-18 LAB — CALCULI, WITH PHOTOGRAPH (CLINICAL LAB)
Calcium Oxalate Dihydrate: 30 %
Calcium Oxalate Monohydrate: 70 %
Weight Calculi: 14 mg

## 2020-08-22 ENCOUNTER — Ambulatory Visit
Admission: RE | Admit: 2020-08-22 | Discharge: 2020-08-22 | Disposition: A | Discharge status: Home / Self Care | Payer: Managed Care, Other (non HMO) | Attending: Urology | Admitting: Urology

## 2020-08-22 ENCOUNTER — Other Ambulatory Visit: Payer: Self-pay

## 2020-08-22 ENCOUNTER — Ambulatory Visit
Admission: RE | Admit: 2020-08-22 | Discharge: 2020-08-22 | Disposition: A | Discharge status: Home / Self Care | Payer: Managed Care, Other (non HMO) | Source: Ambulatory Visit | Attending: Urology | Admitting: Urology

## 2020-08-22 ENCOUNTER — Ambulatory Visit (INDEPENDENT_AMBULATORY_CARE_PROVIDER_SITE_OTHER): Payer: Managed Care, Other (non HMO) | Admitting: Urology

## 2020-08-22 ENCOUNTER — Encounter: Payer: Self-pay | Admitting: Urology

## 2020-08-22 VITALS — BP 156/95 | HR 97 | Ht 74.0 in | Wt 178.4 lb

## 2020-08-22 DIAGNOSIS — Z466 Encounter for fitting and adjustment of urinary device: Secondary | ICD-10-CM | POA: Diagnosis not present

## 2020-08-22 DIAGNOSIS — N23 Unspecified renal colic: Secondary | ICD-10-CM | POA: Diagnosis not present

## 2020-08-22 DIAGNOSIS — N201 Calculus of ureter: Secondary | ICD-10-CM | POA: Insufficient documentation

## 2020-08-22 MED ORDER — CEPHALEXIN 250 MG PO CAPS
500.0000 mg | ORAL_CAPSULE | Freq: Once | ORAL | Status: AC
  Administered 2020-08-22: 500 mg via ORAL

## 2020-08-22 MED ORDER — KETOROLAC TROMETHAMINE 60 MG/2ML IM SOLN
60.0000 mg | Freq: Once | INTRAMUSCULAR | Status: AC
Start: 2020-08-22 — End: 2020-08-22
  Administered 2020-08-22: 60 mg via INTRAMUSCULAR

## 2020-08-22 NOTE — Patient Instructions (Signed)
Ureteral Stent Implantation, Care After This sheet gives you information about how to care for yourself after your procedure. Your health care provider may also give you more specific instructions. If you have problems or questions, contact your health care provider. What can I expect after the procedure? After the procedure, it is common to have:  Nausea.  Mild pain when you urinate. You may feel this pain in your lower back or lower abdomen. The pain should stop within a few minutes after you urinate. This may last for up to 1 week.  A small amount of blood in your urine for several days. Follow these instructions at home: Medicines  Take over-the-counter and prescription medicines only as told by your health care provider.  If you were prescribed an antibiotic medicine, take it as told by your health care provider. Do not stop taking the antibiotic even if you start to feel better.  Do not drive for 24 hours if you were given a sedative during your procedure.  Ask your health care provider if the medicine prescribed to you requires you to avoid driving or using heavy machinery. Activity  Rest as told by your health care provider.  Avoid sitting for a long time without moving. Get up to take short walks every 1-2 hours. This is important to improve blood flow and breathing. Ask for help if you feel weak or unsteady.  Return to your normal activities as told by your health care provider. Ask your health care provider what activities are safe for you. General instructions  Watch for any blood in your urine. Call your health care provider if the amount of blood in your urine increases.  If you have a catheter: ? Follow instructions from your health care provider about taking care of your catheter and collection bag. ? Do not take baths, swim, or use a hot tub until your health care provider approves. Ask your health care provider if you may take showers. You may only be allowed to  take sponge baths.  Drink enough fluid to keep your urine pale yellow.  Do not use any products that contain nicotine or tobacco, such as cigarettes, e-cigarettes, and chewing tobacco. These can delay healing after surgery. If you need help quitting, ask your health care provider.  Keep all follow-up visits as told by your health care provider. This is important.   Contact a health care provider if:  You have pain that gets worse or does not get better with medicine, especially pain when you urinate.  You have difficulty urinating.  You feel nauseous or you vomit repeatedly during a period of more than 2 days after the procedure. Get help right away if:  Your urine is dark red or has blood clots in it.  You are leaking urine (have incontinence).  The end of the stent comes out of your urethra.  You cannot urinate.  You have sudden, sharp, or severe pain in your abdomen or lower back.  You have a fever.  You have swelling or pain in your legs.  You have difficulty breathing. Summary  After the procedure, it is common to have mild pain when you urinate that goes away within a few minutes after you urinate. This may last for up to 1 week.  Watch for any blood in your urine. Call your health care provider if the amount of blood in your urine increases.  Take over-the-counter and prescription medicines only as told by your health care provider.    Drink enough fluid to keep your urine pale yellow. This information is not intended to replace advice given to you by your health care provider. Make sure you discuss any questions you have with your health care provider. Document Revised: 04/14/2018 Document Reviewed: 04/15/2018 Elsevier Patient Education  2021 [employer]vier Inc.  

## 2020-08-22 NOTE — Progress Notes (Signed)
   08/22/20  CC:  Chief Complaint  Patient presents with  . Cysto Stent Removal    HPI: 35 year old male with mid ureteral calculus status post ureteroscopy last week.  He did have some difficulty advancing the scope beyond where the stone was located however there is minimal suspicion for retropulsion of any fragments.  KUB today is fairly unremarkable without any significant stone burden alongside the stent.  He presents today for stent removal.  He has had some irritation from the stent along with ongoing bleeding.  He is anxious to have the stent removed today.  Blood pressure (!) 156/95, pulse 97, height 6\' 2"  (1.88 m), weight 178 lb 6.4 oz (80.9 kg). NED. A&Ox3.   No respiratory distress   Abd soft, NT, ND Normal phallus with bilateral descended testicles  Cystoscopy/ Stent removal procedure  Patient identification was confirmed, informed consent was obtained, and patient was prepped using Betadine solution.  Lidocaine jelly was administered per urethral meatus.    Preoperative abx where received prior to procedure.    Procedure: - Flexible cystoscope introduced, without any difficulty.   - Thorough search of the bladder revealed:    normal urethral meatus  Stent seen emanating from left ureteral orifice, grasped with stent graspers, and removed in entirety.  I did initially have some difficulty performing this procedure and ended up switching out scopes which did allow for successful removal of the stent.  Moderate debris in bladder limiting visualization.  Post-Procedure: - Patient tolerated the procedure well   Assessment/ Plan:  1. Encounter for removal of ureteral stent Stent removed today with some mild difficulty but ultimately successful  He did receive paraprocedural antibiotics  Warning symptoms were reviewed in detail  Anticipate improved symptomology in the next 24 to 48 hours.  He is advised to call if his symptoms fail to improve  Given his  discomfort today, we did administer 30 mg of IM Toradol to help with spasm and postprocedural discomfort. - cephALEXin (KEFLEX) capsule 500 mg - ketorolac (TORADOL) injection 60 mg  2. Renal colic As above - Urinalysis, Complete    Follow-up in 4 weeks with renal ultrasound prior  , MD

## 2020-08-23 LAB — URINALYSIS, COMPLETE
Bilirubin, UA: NEGATIVE
Glucose, UA: NEGATIVE
Ketones, UA: NEGATIVE
Nitrite, UA: NEGATIVE
Specific Gravity, UA: 1.02 (ref 1.005–1.030)
Urobilinogen, Ur: 0.2 mg/dL (ref 0.2–1.0)
pH, UA: 5.5 (ref 5.0–7.5)

## 2020-08-23 LAB — MICROSCOPIC EXAMINATION: RBC, Urine: >30 /hpf — AB (ref 0–2)

## 2020-08-24 ENCOUNTER — Encounter: Payer: Self-pay | Admitting: Urology

## 2020-08-25 ENCOUNTER — Ambulatory Visit (INDEPENDENT_AMBULATORY_CARE_PROVIDER_SITE_OTHER): Payer: Managed Care, Other (non HMO) | Admitting: Physician Assistant

## 2020-08-25 ENCOUNTER — Encounter: Payer: Self-pay | Admitting: Physician Assistant

## 2020-08-25 ENCOUNTER — Other Ambulatory Visit: Payer: Self-pay

## 2020-08-25 ENCOUNTER — Ambulatory Visit: Payer: Self-pay | Admitting: Physician Assistant

## 2020-08-25 ENCOUNTER — Other Ambulatory Visit: Payer: Self-pay | Admitting: Physician Assistant

## 2020-08-25 VITALS — BP 150/99 | HR 90 | Ht 74.0 in | Wt 178.0 lb

## 2020-08-25 DIAGNOSIS — N3001 Acute cystitis with hematuria: Secondary | ICD-10-CM

## 2020-08-25 LAB — URINALYSIS, COMPLETE
Bilirubin, UA: NEGATIVE
Glucose, UA: NEGATIVE
Nitrite, UA: POSITIVE — AB
Specific Gravity, UA: 1.025 (ref 1.005–1.030)
Urobilinogen, Ur: 0.2 mg/dL (ref 0.2–1.0)
pH, UA: 5.5 (ref 5.0–7.5)

## 2020-08-25 LAB — MICROSCOPIC EXAMINATION: RBC, Urine: >30 /hpf — AB (ref 0–2)

## 2020-08-25 MED ORDER — CIPROFLOXACIN HCL 250 MG PO TABS: 250.0000 mg | ORAL_TABLET | Freq: Two times a day (BID) | ORAL | 0 refills | Status: DC

## 2020-08-25 NOTE — Progress Notes (Signed)
08/25/2020 1:05 PM   Kenneth Rocha [birth year] [id number]  CC: Chief Complaint  Patient presents with  . Other    Post stent pain     HPI: Kenneth Rocha is a 35 y.o. male who recently underwent left URS/LL/stent placement with Dr. Apolinar Junes for management of a 7 mm mid left ureteral calculus with follow-up cystoscopy stent removal in clinic 3 days ago who presents today for evaluation of possible UTI.  He received Keflex 500 mg x 1 dose in clinic following stent removal.  Today he reports a 1 day history of dysuria, frequency, straining, groin ache, and gross hematuria.  He denies fever, chills, nausea, vomiting, and flank pain and feels he is emptying his bladder well.  He has taken Cystex for symptomatic improvement.  In-office UA today positive for 2+ ketones, 3+ blood, 2+ protein, nitrites, and trace leukocyte esterase; urine microscopy with 11-30 WBCs/HPF, >30 RBCs/HPF, and moderate bacteria.  PMH: Past Medical History:  Diagnosis Date  . ADHD   . ED (erectile dysfunction)   . Kidney stone     Surgical History: Past Surgical History:  Procedure Laterality Date  . CYSTOSCOPY/URETEROSCOPY/HOLMIUM LASER/STENT PLACEMENT Left 08/14/2020   Procedure: CYSTOSCOPY/URETEROSCOPY/HOLMIUM LASER/STENT PLACEMENT;  Surgeon: Vanna Scotland, MD;  Location: [facility] ORS;  Service: Urology;  Laterality: Left;  . none      Home Medications:  Allergies as of 08/25/2020      Reactions   Sulfa Antibiotics Hives      Medication List       Accurate as of August 25, 2020  1:05 PM. If you have any questions, ask your nurse or doctor.        amphetamine-dextroamphetamine 20 MG tablet Commonly known as: ADDERALL Take 20 mg by mouth daily as needed (ADHD).   amphetamine-dextroamphetamine 25 MG 24 hr capsule Commonly known as: ADDERALL XR Take 25 mg by mouth daily.   oxybutynin 5 MG tablet Commonly known as: DITROPAN Take 1 tablet (5 mg total) by mouth every 8 (eight) hours as needed for bladder  spasms.   oxyCODONE-acetaminophen 5-325 MG tablet Commonly known as: PERCOCET/ROXICET Take 1 tablet by mouth every 6 (six) hours as needed for severe pain.   sildenafil 50 MG tablet Commonly known as: VIAGRA Take 50 mg by mouth daily as needed for erectile dysfunction.   tamsulosin 0.4 MG Caps capsule Commonly known as: Flomax Take 1 capsule (0.4 mg total) by mouth daily.       Allergies:  Allergies  Allergen Reactions  . Sulfa Antibiotics Hives    Family History: Family History  Problem Relation Age of Onset  . Prostate cancer Neg Hx   . Kidney cancer Neg Hx     Social History:   reports that he has never smoked. He has never used smokeless tobacco. He reports current alcohol use. He reports that he does not use drugs.  Physical Exam: BP (!) 150/99   Pulse 90   Ht 6\' 2"  (1.88 m)   Wt 178 lb (80.7 kg)   BMI 22.85 kg/m   Constitutional:  Alert and oriented, no acute distress, nontoxic appearing HEENT: [place], AT Cardiovascular: No clubbing, cyanosis, or edema Respiratory: Normal respiratory effort, no increased work of breathing Skin: No rashes, bruises or suspicious lesions Neurologic: Grossly intact, no focal deficits, moving all 4 extremities Psychiatric: Normal mood and affect  Laboratory Data: Results for orders placed or performed in visit on 08/25/20  CULTURE, URINE COMPREHENSIVE   Specimen: Urine   UR  Result  Value Ref Range   Urine Culture, Comprehensive Preliminary report    Organism ID, Bacteria Comment   Microscopic Examination   Urine  Result Value Ref Range   WBC, UA 11-30 (A) 0 - 5 /hpf   RBC >30 (A) 0 - 2 /hpf   Epithelial Cells (non renal) 0-10 0 - 10 /hpf   Mucus, UA Present (A) Not Estab.   Bacteria, UA Moderate (A) None seen/Few  Urinalysis, Complete  Result Value Ref Range   Specific Gravity, UA 1.025 1.005 - 1.030   pH, UA 5.5 5.0 - 7.5   Color, UA Orange Yellow   Appearance Ur Cloudy (A) Clear   Leukocytes,UA Trace (A) Negative    Protein,UA 2+ (A) Negative/Trace   Glucose, UA Negative Negative   Ketones, UA 2+ (A) Negative   RBC, UA 3+ (A) Negative   Bilirubin, UA Negative Negative   Urobilinogen, Ur 0.2 0.2 - 1.0 mg/dL   Nitrite, UA Positive (A) Negative   Microscopic Examination See below:    Assessment & Plan:   1. Acute cystitis with hematuria UA grossly infected today, though nitrites likely a false positive with recent Cystex use.  Will start empiric Cipro and send for culture for further evaluation.  Will defer repeat UA to prove resolution of MH given plans for postop follow-up in 1 month with Dr. Apolinar Junes. - Urinalysis, Complete - CULTURE, URINE COMPREHENSIVE - ciprofloxacin (CIPRO) 250 MG tablet; Take 1 tablet (250 mg total) by mouth 2 (two) times daily for 7 days.  Dispense: 14 tablet; Refill: 0   Return if symptoms worsen or fail to improve.  Carman Ching, PA-C  [facility] [address] [place], [place] [address] [phone]

## 2020-08-30 LAB — CULTURE, URINE COMPREHENSIVE

## 2020-09-06 ENCOUNTER — Other Ambulatory Visit: Payer: Self-pay | Admitting: Urology

## 2020-09-12 ENCOUNTER — Telehealth: Payer: Self-pay | Admitting: *Deleted

## 2020-09-12 DIAGNOSIS — N132 Hydronephrosis with renal and ureteral calculous obstruction: Secondary | ICD-10-CM

## 2020-09-12 NOTE — Telephone Encounter (Signed)
Spoke with patient to get RUS completed prior to appointment-patient voiced understanding. Order placed for RUS.

## 2020-09-18 ENCOUNTER — Other Ambulatory Visit: Payer: Self-pay

## 2020-09-18 ENCOUNTER — Ambulatory Visit
Admission: RE | Admit: 2020-09-18 | Discharge: 2020-09-18 | Disposition: A | Discharge status: Home / Self Care | Payer: Managed Care, Other (non HMO) | Source: Ambulatory Visit | Attending: Urology | Admitting: Urology

## 2020-09-18 DIAGNOSIS — N132 Hydronephrosis with renal and ureteral calculous obstruction: Secondary | ICD-10-CM | POA: Diagnosis not present

## 2020-09-19 ENCOUNTER — Ambulatory Visit (INDEPENDENT_AMBULATORY_CARE_PROVIDER_SITE_OTHER): Payer: Managed Care, Other (non HMO) | Admitting: Urology

## 2020-09-19 ENCOUNTER — Encounter: Payer: Self-pay | Admitting: Urology

## 2020-09-19 VITALS — BP 170/95 | HR 92 | Ht 74.0 in | Wt 176.0 lb

## 2020-09-19 DIAGNOSIS — Z87442 Personal history of urinary calculi: Secondary | ICD-10-CM | POA: Diagnosis not present

## 2020-09-19 NOTE — Progress Notes (Signed)
09/19/2020 4:04 PM   Kenneth Rocha January 18, 1986 [id number]  Referring provider: No referring provider defined for this encounter.  Chief Complaint  Patient presents with  . Results    4wk follow up    HPI: 35 year old male who presents today for follow-up of kidney stones.  He had a 7r mid ureteral calculus and underwent ureteroscopy on 08/14/2020.  There is some difficulty advancing the scope.  Ultimately we are able to successfully remove all stone burden.  He returned for cystoscopy stent removal.  Postoperative course was complicated by urinary tract infection.  He reports that after finishing antibiotics, his urinary symptoms resolved.  He is no longer having flank pain.  He is doing well today.  Renal ultrasound shows no bilateral hydroureteronephrosis residual stone burden or any other pathology.  He did have a slightly thickened bladder likely associated with recent cystitis.  He has remote history of kidney stones, spontaneously passed a stone in 2012.  None since.  He does report being chronically dehydrated.  Strong family history of kidney stones.   Stone composition is primarily calcium oxalate, 70% monohydrate, 30% dihydrate.   PMH: Past Medical History:  Diagnosis Date  . ADHD   . ED (erectile dysfunction)   . Kidney stone     Surgical History: Past Surgical History:  Procedure Laterality Date  . CYSTOSCOPY/URETEROSCOPY/HOLMIUM LASER/STENT PLACEMENT Left 08/14/2020   Procedure: CYSTOSCOPY/URETEROSCOPY/HOLMIUM LASER/STENT PLACEMENT;  Surgeon: Vanna Scotland, MD;  Location: [facility] ORS;  Service: Urology;  Laterality: Left;  . none      Home Medications:  Allergies as of 09/19/2020      Reactions   Sulfa Antibiotics Hives      Medication List       Accurate as of September 19, 2020  4:04 PM. If you have any questions, ask your nurse or doctor.        STOP taking these medications   levofloxacin 500 MG tablet Commonly known as: LEVAQUIN Stopped by: Vanna Scotland, MD   oxybutynin 5 MG tablet Commonly known as: DITROPAN Stopped by: Vanna Scotland, MD   oxyCODONE-acetaminophen 5-325 MG tablet Commonly known as: PERCOCET/ROXICET Stopped by: Vanna Scotland, MD     TAKE these medications   amphetamine-dextroamphetamine 20 MG tablet Commonly known as: ADDERALL Take 20 mg by mouth daily as needed (ADHD).   amphetamine-dextroamphetamine 25 MG 24 hr capsule Commonly known as: ADDERALL XR Take 25 mg by mouth daily.   lamoTRIgine 25 MG tablet Commonly known as: LAMICTAL Take by mouth.   sildenafil 50 MG tablet Commonly known as: VIAGRA Take 50 mg by mouth daily as needed for erectile dysfunction.   tamsulosin 0.4 MG Caps capsule Commonly known as: FLOMAX TAKE 1 CAPSULE BY MOUTH EVERY DAY       Allergies:  Allergies  Allergen Reactions  . Sulfa Antibiotics Hives    Family History: Family History  Problem Relation Age of Onset  . Prostate cancer Neg Hx   . Kidney cancer Neg Hx     Social History:  reports that he has never smoked. He has never used smokeless tobacco. He reports current alcohol use. He reports that he does not use drugs.   Physical Exam: BP (!) 170/95   Pulse 92   Ht 6\' 2"  (1.88 m)   Wt 176 lb (79.8 kg)   BMI 22.60 kg/m   Constitutional:  Alert and oriented, No acute distress. HEENT: [place] AT, moist mucus membranes.  Trachea midline, no masses. Cardiovascular: No clubbing, cyanosis, or  edema. Respiratory: Normal respiratory effort, no increased work of breathing. Skin: No rashes, bruises or suspicious lesions. Neurologic: Grossly intact, no focal deficits, moving all 4 extremities. Psychiatric: Normal mood and affect.  Pertinent Imaging:  Ultrasound renal complete  Narrative CLINICAL DATA:  Kidney stone  EXAM: RENAL / URINARY TRACT ULTRASOUND COMPLETE  COMPARISON:  08/22/2020, CT 08/09/2020  FINDINGS: Right Kidney:  Renal measurements: 10.3 x 5.9 x 5.5 cm = volume: 177.9  mL. Echogenicity within normal limits. No mass or hydronephrosis visualized.  Left Kidney:  Renal measurements: 11.4 x 6.6 x 5.5 cm = volume: 216.8 mL. Echogenicity within normal limits. No mass or hydronephrosis visualized.  Bladder:  Slightly thick-walled appearance of the bladder.  Other:  None.  IMPRESSION: 1. Normal ultrasound appearance of the kidneys. 2. Bladder appears slightly thick-walled   Electronically Signed By: Jasmine Pang M.D. On: 09/18/2020 22:33  Renal ultrasound images were personally reviewed today.  Agree with radiologic interpretation.  Assessment & Plan:    1. History of kidney stones Status status post successful ureteroscopy  Follow-up renal ultrasound was personally reviewed today, no pathology ?hydronephrosis  Stone composition reviewed  We discussed general stone prevention techniques including drinking plenty water with goal of producing 2.5 L urine daily, increased citric acid intake, avoidance of high oxalate containing foods, and decreased salt intake.  Information about dietary recommendations given today.     Follow-up as needed   Vanna Scotland, MD  [facility] [address], [address] [place], [place] [address] [phone]

## 2020-09-29 ENCOUNTER — Other Ambulatory Visit: Payer: Self-pay | Admitting: Urology

## 2020-10-22 ENCOUNTER — Other Ambulatory Visit: Payer: Self-pay | Admitting: Urology

## 2021-02-27 NOTE — Progress Notes (Signed)
02/28/21 2:37 PM   Kenneth Rocha 20-Mar-1986 [id number]  Referring provider:  No referring provider defined for this encounter. Chief Complaint  Patient presents with   Testicle Pain     HPI: Kenneth Rocha is a 35 y.o.male with history of nephrolithiasis who presents today for swelling of the right testicle.   Notably, has a personal history of kidney stones and is status post ureteroscopy on 08/14/2020.   Today, he reports that a few weeks ago, he started to have right testicular pain and swelling.  The swelling has since resolved but he still has some tenderness especially in his groin area.  He reports it feels almost like he is injured his groin but does not recall any groin trauma.  He denies any associated urinary symptoms.  He states the pain feels like he has inquired injury in his groin. This  He has been going through a separation and has had a lot of stress.  He is not currently sexually active and has no risk for STIs.  Urinalysis today shows  2+ proteins and trace leukocytes.    PMH: Past Medical History:  Diagnosis Date   ADHD    ED (erectile dysfunction)    Kidney stone     Surgical History: Past Surgical History:  Procedure Laterality Date   CYSTOSCOPY/URETEROSCOPY/HOLMIUM LASER/STENT PLACEMENT Left 08/14/2020   Procedure: CYSTOSCOPY/URETEROSCOPY/HOLMIUM LASER/STENT PLACEMENT;  Surgeon: Vanna Scotland, MD;  Location: [facility] ORS;  Service: Urology;  Laterality: Left;   none      Home Medications:  Allergies as of 02/28/2021       Reactions   Sulfa Antibiotics Hives        Medication List        Accurate as of February 28, 2021  2:37 PM. If you have any questions, ask your nurse or doctor.          STOP taking these medications    tamsulosin 0.4 MG Caps capsule Commonly known as: FLOMAX Stopped by: Vanna Scotland, MD       TAKE these medications    amphetamine-dextroamphetamine 20 MG 24 hr capsule Commonly known as: ADDERALL XR Take 20  mg by mouth daily. What changed: Another medication with the same name was removed. Continue taking this medication, and follow the directions you see here. Changed by: Vanna Scotland, MD   FLUoxetine 40 MG capsule Commonly known as: PROZAC Take 40 mg by mouth daily.   lamoTRIgine 200 MG tablet Commonly known as: LAMICTAL Take 200 mg by mouth daily. What changed: Another medication with the same name was removed. Continue taking this medication, and follow the directions you see here. Changed by: Vanna Scotland, MD   sildenafil 50 MG tablet Commonly known as: VIAGRA Take 50 mg by mouth daily as needed for erectile dysfunction.        Allergies:  Allergies  Allergen Reactions   Sulfa Antibiotics Hives    Family History: Family History  Problem Relation Age of Onset   Prostate cancer Neg Hx    Kidney cancer Neg Hx     Social History:  reports that he has never smoked. He has never used smokeless tobacco. He reports current alcohol use. He reports that he does not use drugs.   Physical Exam: BP 125/88   Pulse (!) 130   Ht 6\' 2"  (1.88 m)   Wt 180 lb (81.6 kg)   BMI 23.11 kg/m   Constitutional:  Alert and oriented, No acute distress. HEENT: [place] AT, moist mucus  membranes.  Trachea midline, no masses. Cardiovascular: No clubbing, cyanosis, or edema. Respiratory: Normal respiratory effort, no increased work of breathing GU: No CVA tenderness, mildly enlarged right epididymis which is slightly tender.  Bilateral testicles descended otherwise normal.  No scrotal skin changes.  Skin: No rashes, bruises or suspicious lesions. Neurologic: Grossly intact, no focal deficits, moving all 4 extremities. Psychiatric: Normal mood and affect.   Urinalysis  Results for orders placed or performed in visit on 02/28/21  Microscopic Examination   Urine  Result Value Ref Range   WBC, UA 0-5 0 - 5 /hpf   RBC None seen 0 - 2 /hpf   Epithelial Cells (non renal) None seen 0 - 10 /hpf    Bacteria, UA None seen None seen/Few  Urinalysis, Complete  Result Value Ref Range   Specific Gravity, UA 1.030 1.005 - 1.030   pH, UA 5.5 5.0 - 7.5   Color, UA Yellow Yellow   Appearance Ur Hazy (A) Clear   Leukocytes,UA Trace (A) Negative   Protein,UA 2+ (A) Negative/Trace   Glucose, UA Negative Negative   Ketones, UA Negative Negative   RBC, UA Negative Negative   Bilirubin, UA Negative Negative   Urobilinogen, Ur 0.2 0.2 - 1.0 mg/dL   Nitrite, UA Negative Negative   Microscopic Examination See below:      Assessment & Plan:    Epididymitis - On exam he had a mildly enlarged epididymis, improving with no associated urinary symptoms - Advised him to place cold compress  - Avoid wearing loose undergarments   - Patient denies any suspicion of STIs  -He was advised that if his pain starts to worsen or the swelling does not completely resolve, he should return.  We will hold off on antibiotics for the time being as she is currently improving on no meds, suspect inflammatory mechanical or chemical epididymitis rather than bacterial infection.  History of kidney stones  - Has not had a reoccurrence of kidney stones   Follow-up as needed.   I,Kailey Littlejohn,acting as a Neurosurgeon for Vanna Scotland, MD.,have documented all relevant documentation on the behalf of Vanna Scotland, MD,as directed by  Vanna Scotland, MD while in the presence of Vanna Scotland, MD.  I have reviewed the above documentation for accuracy and completeness, and I agree with the above.   Vanna Scotland, MD  [facility] [address] [place], [place] [address] [phone]

## 2021-02-28 ENCOUNTER — Encounter: Payer: Self-pay | Admitting: Urology

## 2021-02-28 ENCOUNTER — Ambulatory Visit (INDEPENDENT_AMBULATORY_CARE_PROVIDER_SITE_OTHER): Payer: Managed Care, Other (non HMO) | Admitting: Urology

## 2021-02-28 ENCOUNTER — Other Ambulatory Visit: Payer: Self-pay

## 2021-02-28 VITALS — BP 125/88 | HR 130 | Ht 74.0 in | Wt 180.0 lb

## 2021-02-28 DIAGNOSIS — N5089 Other specified disorders of the male genital organs: Secondary | ICD-10-CM | POA: Diagnosis not present

## 2021-03-01 LAB — URINALYSIS, COMPLETE
Bilirubin, UA: NEGATIVE
Glucose, UA: NEGATIVE
Ketones, UA: NEGATIVE
Nitrite, UA: NEGATIVE
RBC, UA: NEGATIVE
Specific Gravity, UA: 1.03 (ref 1.005–1.030)
Urobilinogen, Ur: 0.2 mg/dL (ref 0.2–1.0)
pH, UA: 5.5 (ref 5.0–7.5)

## 2021-03-01 LAB — MICROSCOPIC EXAMINATION
Bacteria, UA: NONE SEEN
Epithelial Cells (non renal): NONE SEEN /hpf (ref 0–10)
RBC, Urine: NONE SEEN /hpf (ref 0–2)

## 2021-05-09 IMAGING — CT CT RENAL STONE PROTOCOL
3 of 4 series · 7 of 46 positions shown, 13 images · non-contrast
Comparison: None.

CLINICAL DATA: Abdominal pain

EXAM:
CT ABDOMEN AND PELVIS WITHOUT CONTRAST
TECHNIQUE: Multidetector CT imaging of the abdomen and pelvis was performed
following the standard protocol without IV contrast.

[Series 4: lung bases · axial · 0.68mm/px · z∈[-300,-260]mm · 3 of 17 slices shown, 7 images]
[im 5/17  soft-tissue]
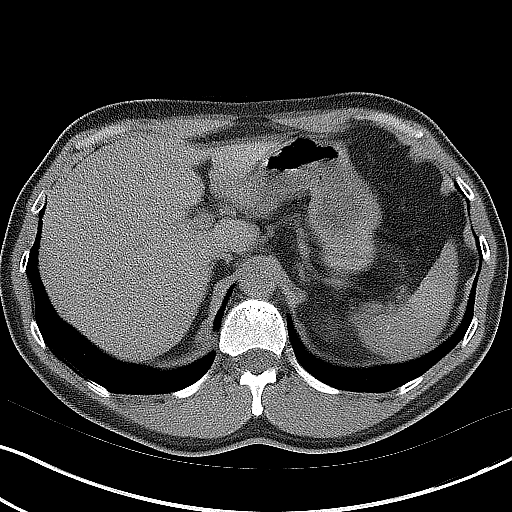
[im 5/17  lung]
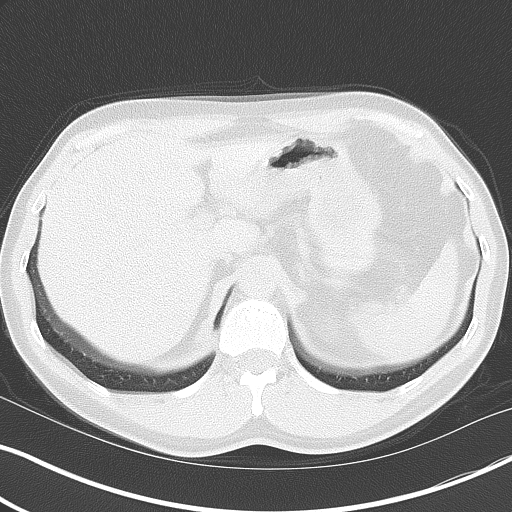
[im 5/17  bone]
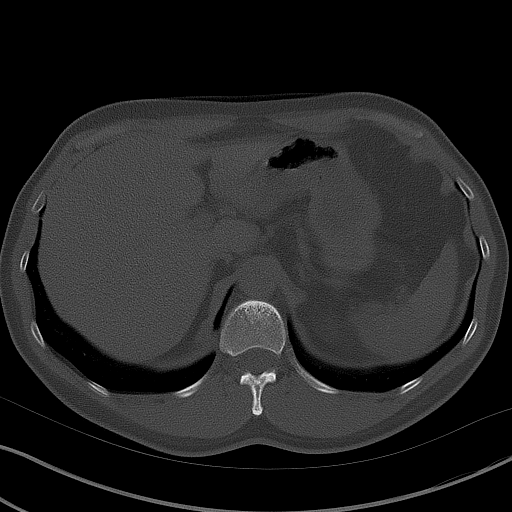
[im 9/17  soft-tissue]
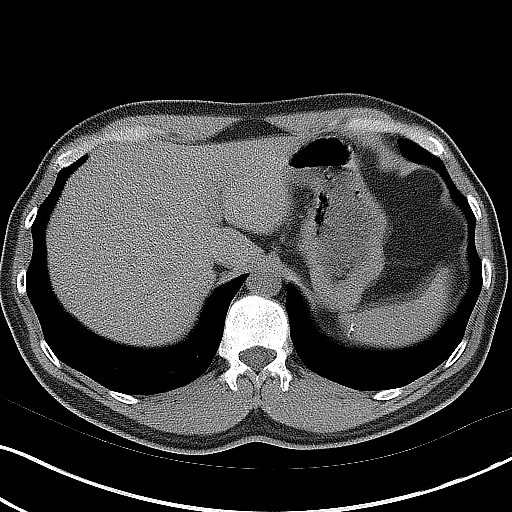
[im 9/17  lung]
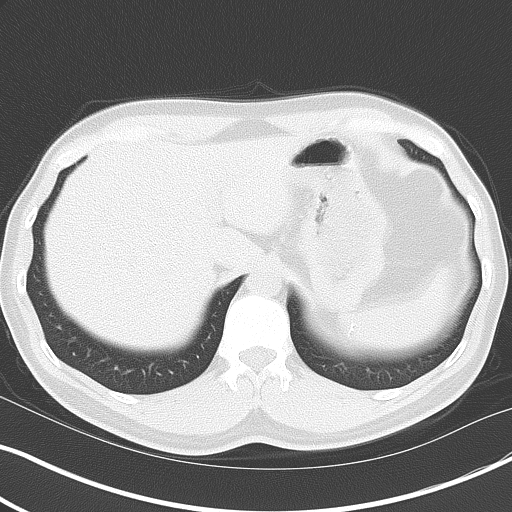
[im 13/17  soft-tissue]
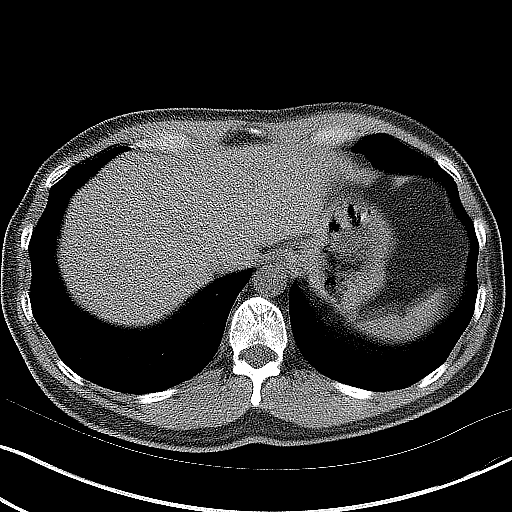
[im 13/17  lung]
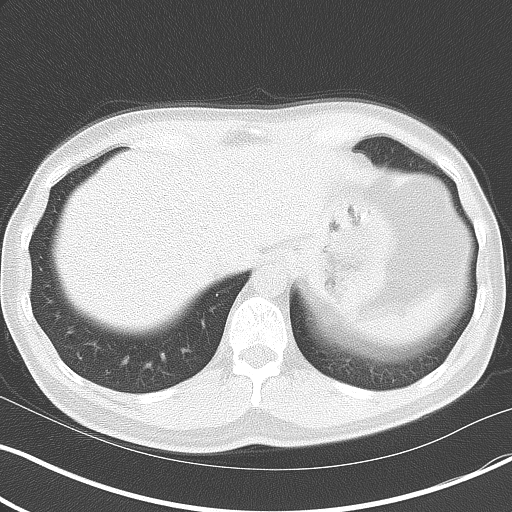

[Series 5: coronal · coronal · 0.76mm/px · 3 of 130 slices shown, 4 images]
[im 44/130  soft-tissue]
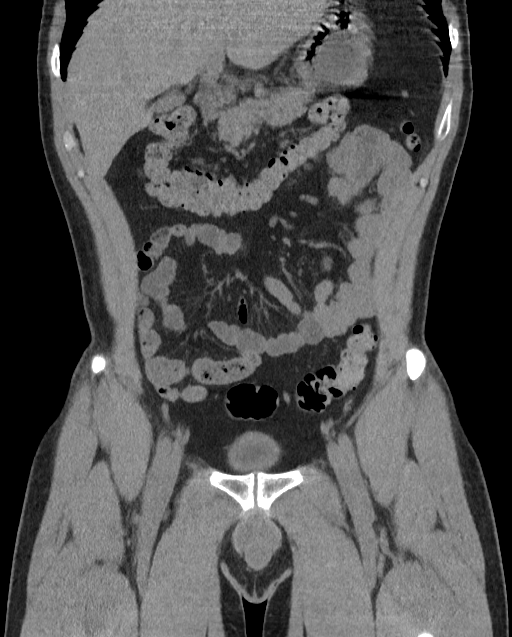
[im 58/130  soft-tissue]
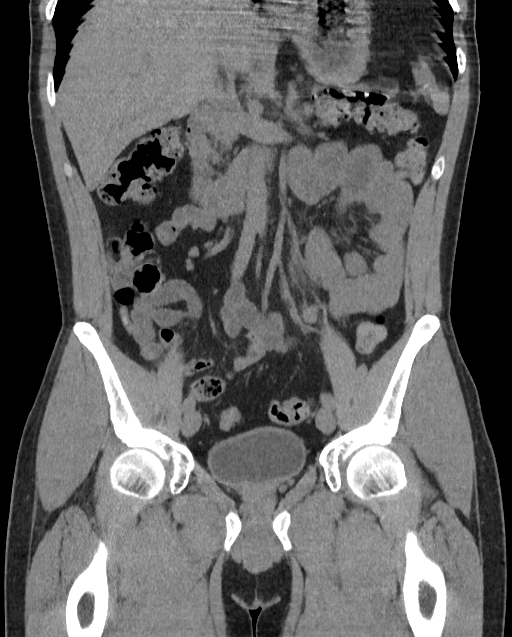
[im 58/130  bone]
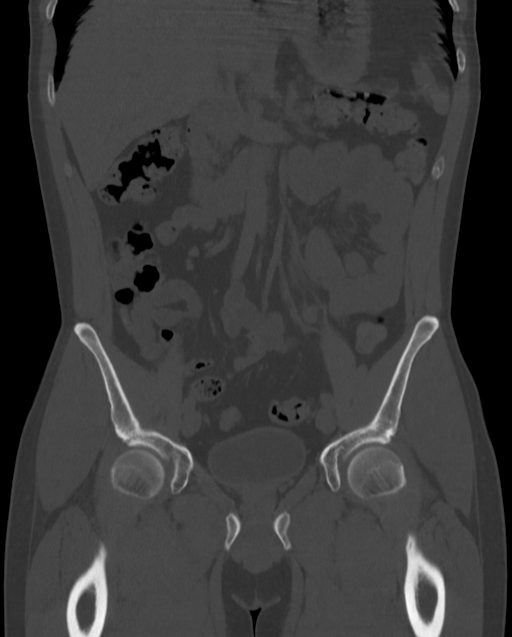
[im 72/130  soft-tissue]
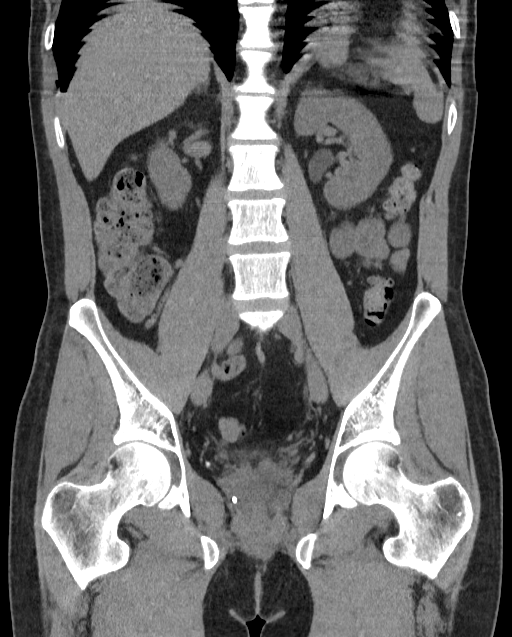

[Series 6: sagittal · sagittal · 0.50mm/px · 1 of 195 slices shown, 2 images]
[im 65/195  soft-tissue]
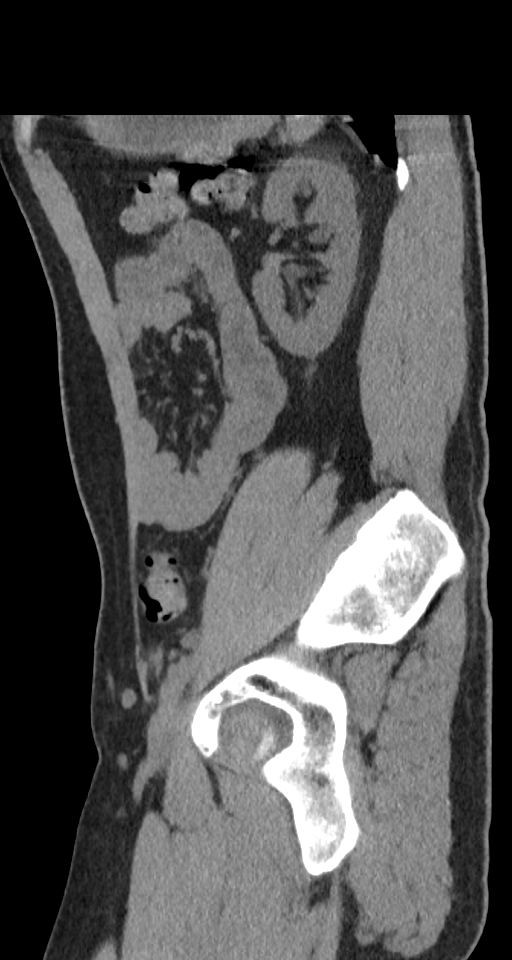
[im 65/195  bone]
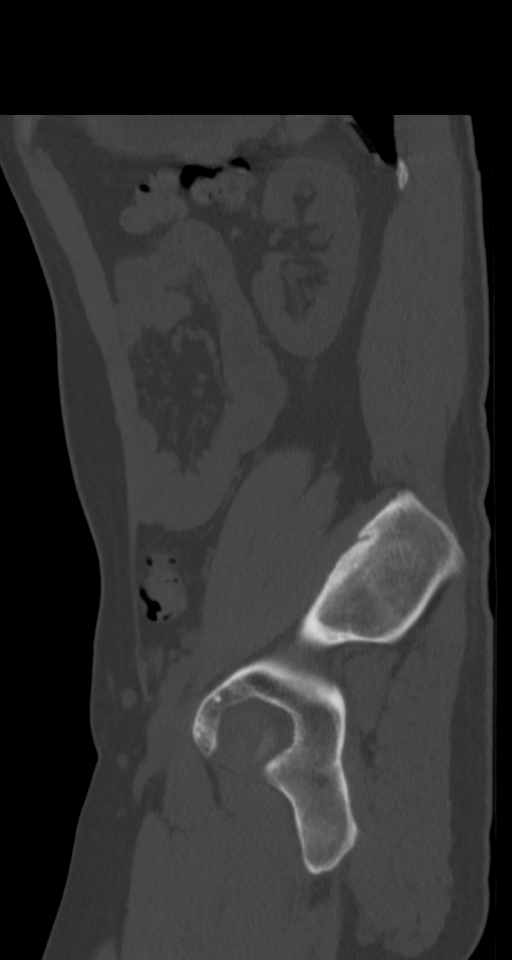

[7 of 46 positions shown; findings below may reference images not displayed]

FINDINGS: Lower chest: No acute abnormality.

Hepatobiliary: Visualized portion liver is unremarkable. Gallbladder
is unremarkable.

Pancreas: No peripancreatic fat stranding. The tail of the pancreas
is suboptimally evaluated secondary to respiratory motion.

Spleen: Splenule. No evidence of perisplenic hematoma. Evaluation
limited secondary to motion.

Adrenals/Urinary Tract: There is mild LEFT hydroureteronephrosis.
There are at least 2 adjacent to ureterolithiasis in the mid LEFT
ureter which span 7 by 5 mm. There is fat stranding adjacent to the
proximal LEFT ureter. Adrenal glands are unremarkable. RIGHT kidney
is unremarkable. There is a punctate nonobstructive RIGHT-sided
nephrolithiasis in the superior pole. There is an additional
punctate nonobstructive LEFT-sided nephrolithiasis in the inferior
pole. Bladder is decompressed.

Stomach/Bowel: The visualized stomach is within normal limits.
Appendix appears normal. No evidence of bowel wall thickening,
distention, or inflammatory changes.

Vascular/Lymphatic: No significant vascular findings are present. No
enlarged abdominal or pelvic lymph nodes.

Reproductive: Prostate is unremarkable.

Other: No free air or free fluid.

Musculoskeletal: Scattered sclerotic foci most consistent with bone
islands. No acute osseous abnormality.
IMPRESSION: 1. Mild LEFT hydroureteronephrosis secondary to 2 obstructing
ureterolithiasis in the mid LEFT ureter. They span 7 x 5 mm in
total. There is fat stranding adjacent to the proximal LEFT ureter
which could be reactive in etiology. Recommend correlation with
urinalysis to exclude superimposed infection.

These results will be called to the ordering clinician or
representative by the Radiologist Assistant, and communication
documented in the PACS or [REDACTED].

## 2021-05-22 IMAGING — CR DG ABDOMEN 1V
1 series · 2 of 2 positions shown · non-contrast
Comparison: 08/14/2020

CLINICAL DATA: Left ureteral calculus

EXAM:
ABDOMEN - 1 VIEW

[Series 1: dg abd 1 view · 0.14mm/px · 2 of 2 slices shown]
[im 1/2]
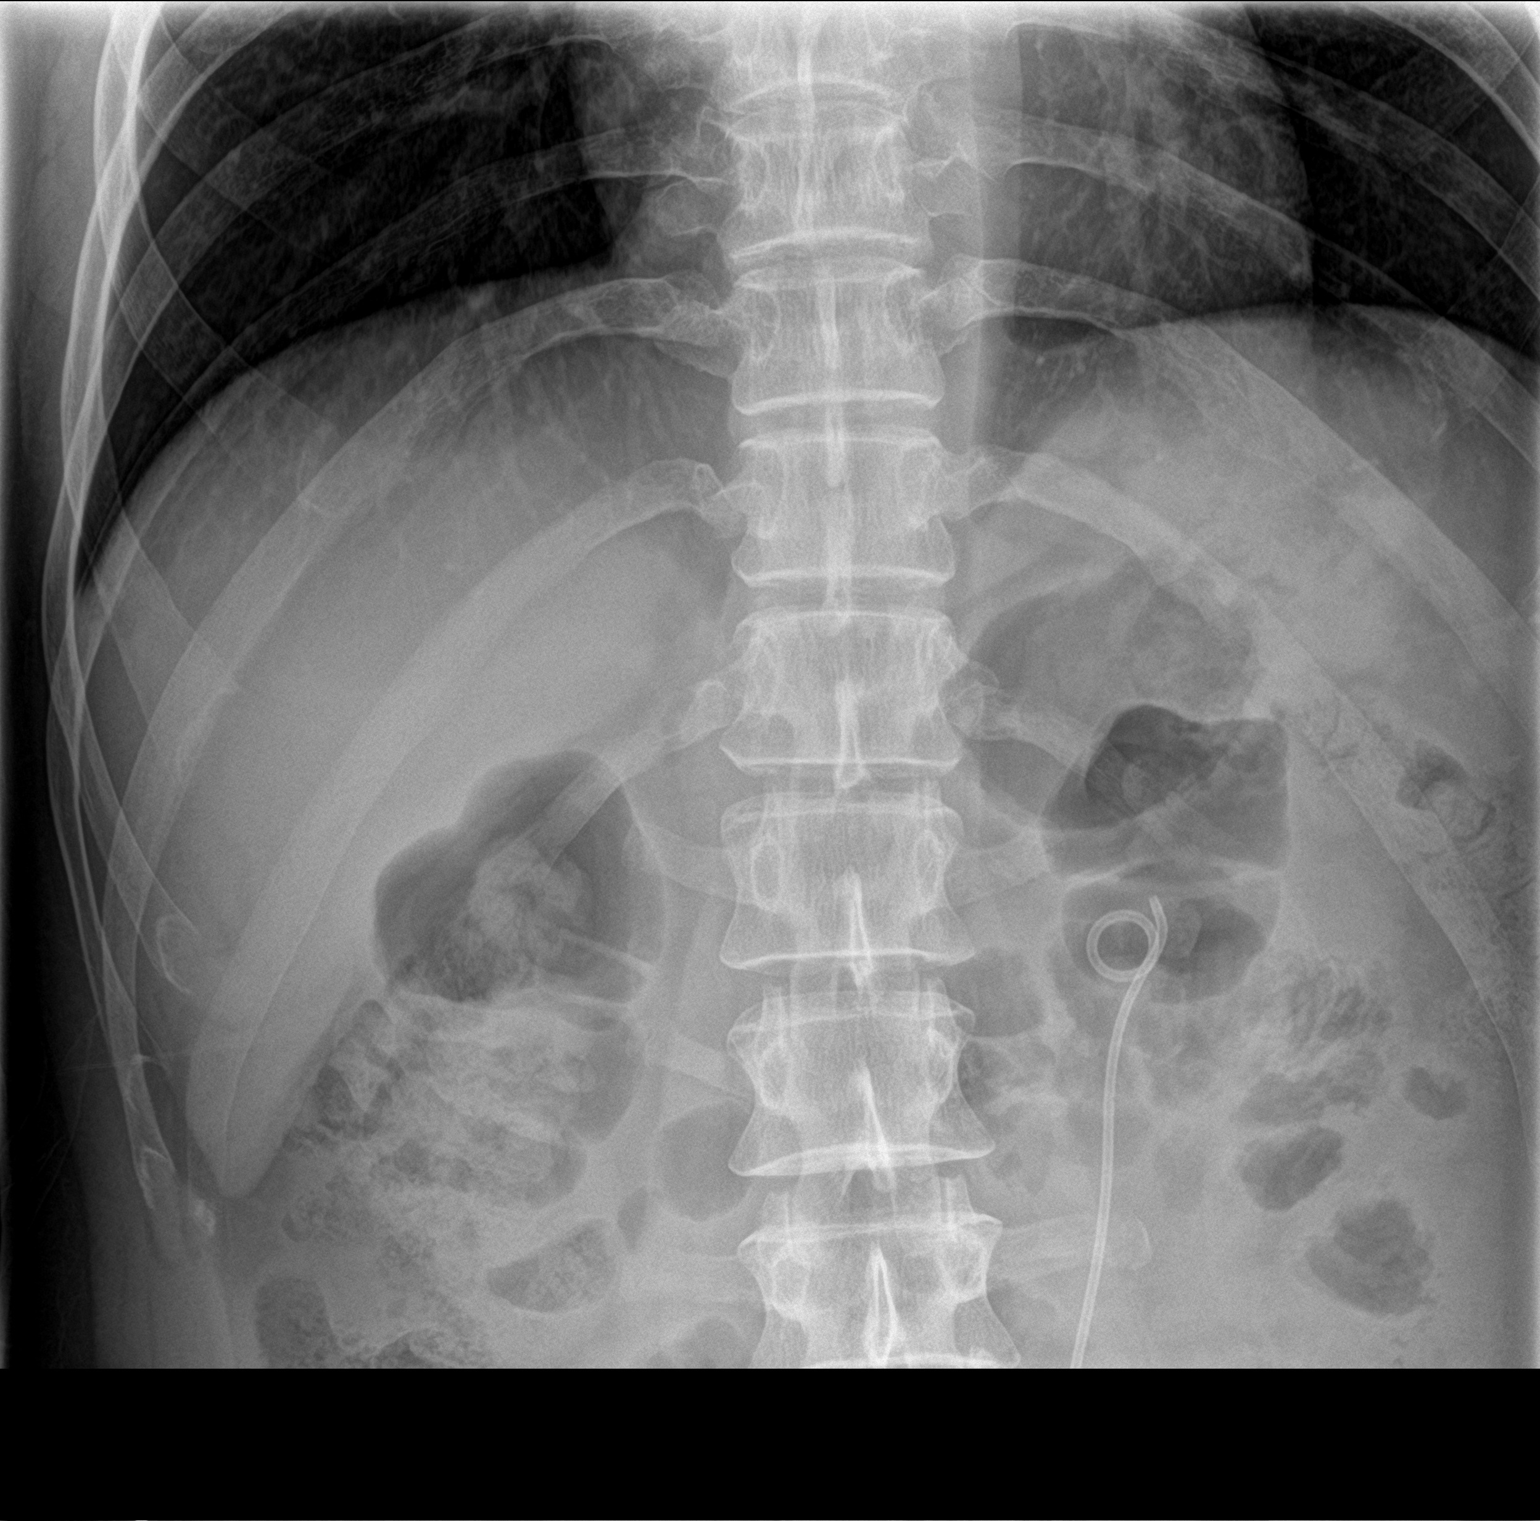
[im 2/2]
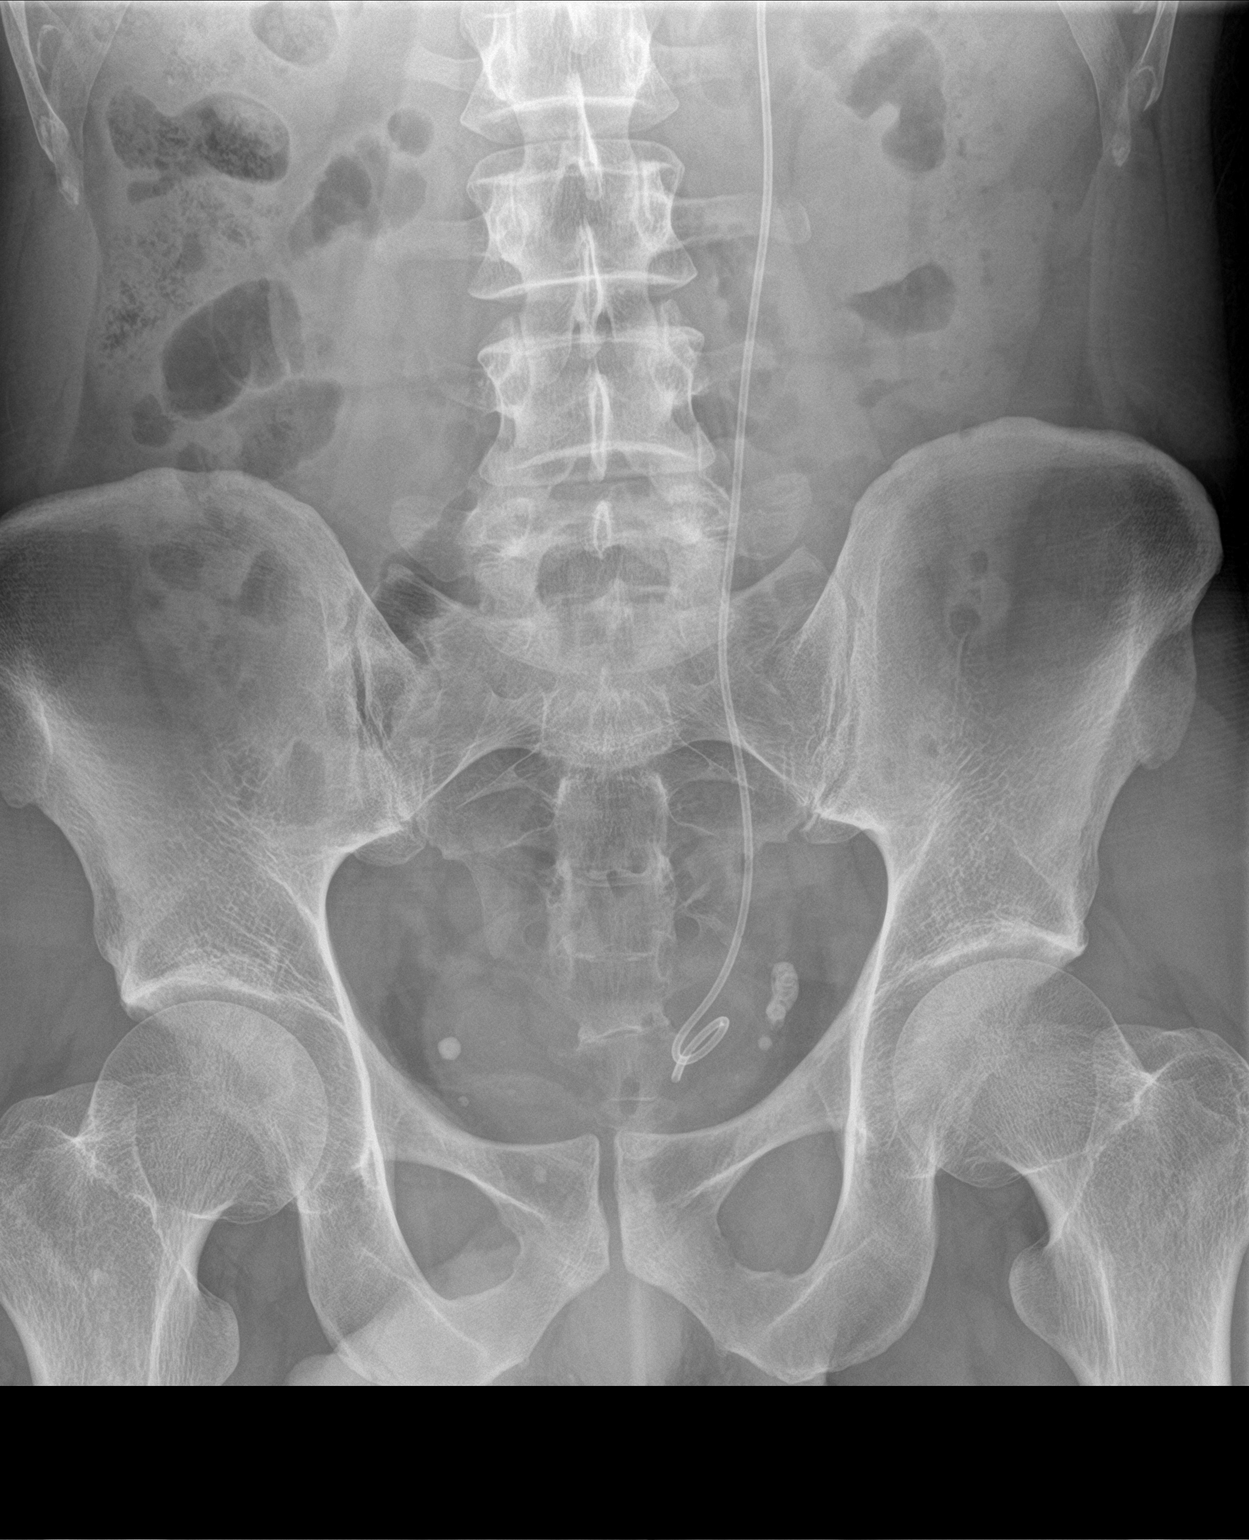

[2 of 2 positions shown; findings below may reference images not displayed]

FINDINGS: Scattered large and small bowel gas is noted. Left ureteral stent is
noted in satisfactory position. Previously seen left ureteral stone
has been removed in the interval. No renal calculi are noted.
IMPRESSION: Left ureteral stent in place.  No ureteral stones are noted.

## 2021-06-18 IMAGING — US US RENAL
1 series · 14 of 25 positions shown · non-contrast
Comparison: 08/22/2020, CT 08/09/2020

CLINICAL DATA: Kidney stone

EXAM:
RENAL / URINARY TRACT ULTRASOUND COMPLETE

[Series 1: us renal · 14 of 41 slices shown]
[im 1/41]
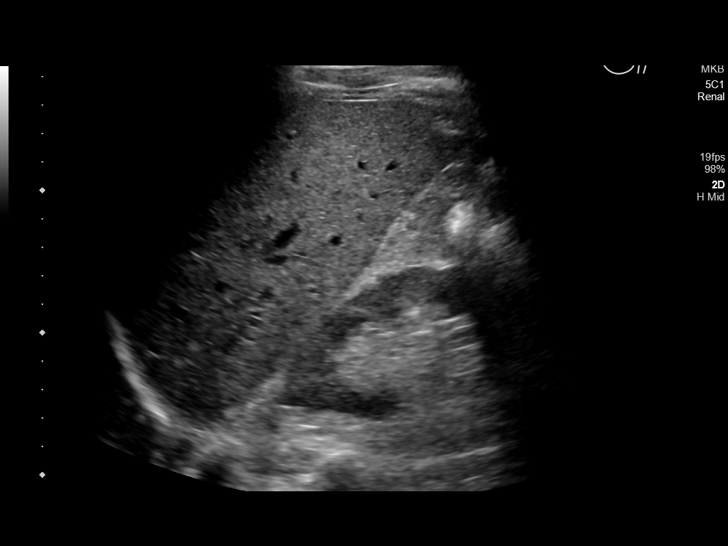
[im 4/41]
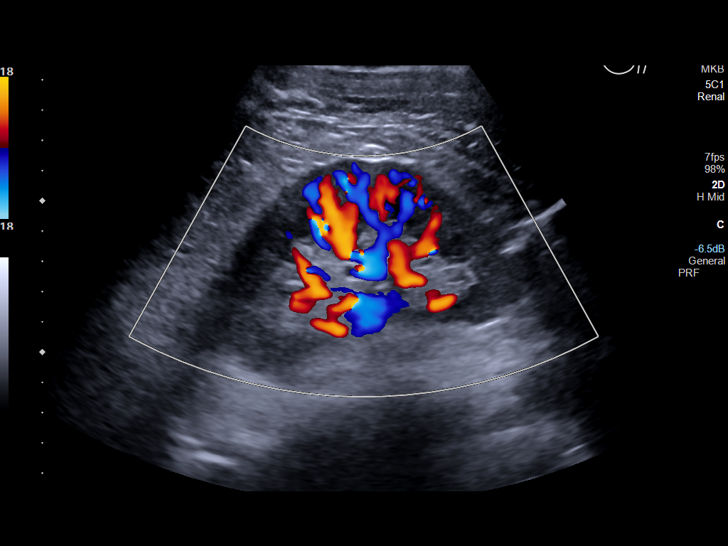
[im 7/41]
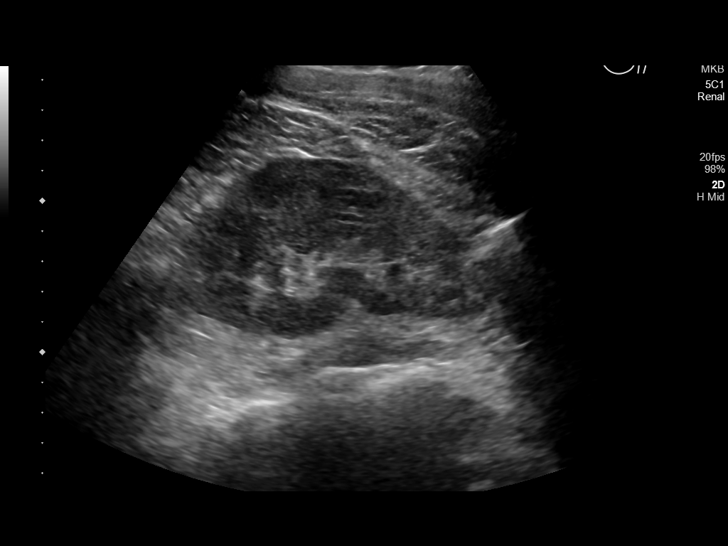
[im 11/41]
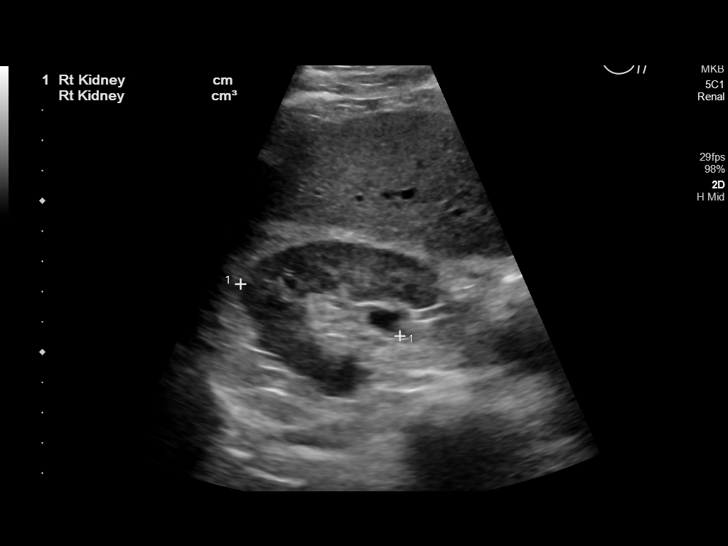
[im 14/41]
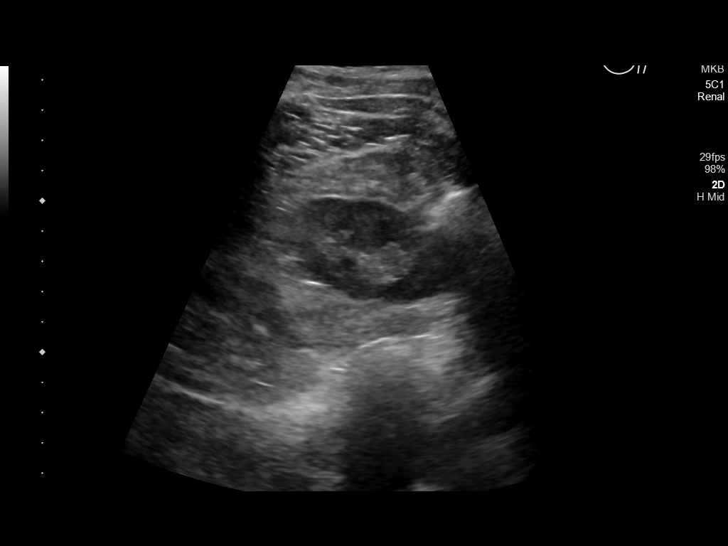
[im 16/41]
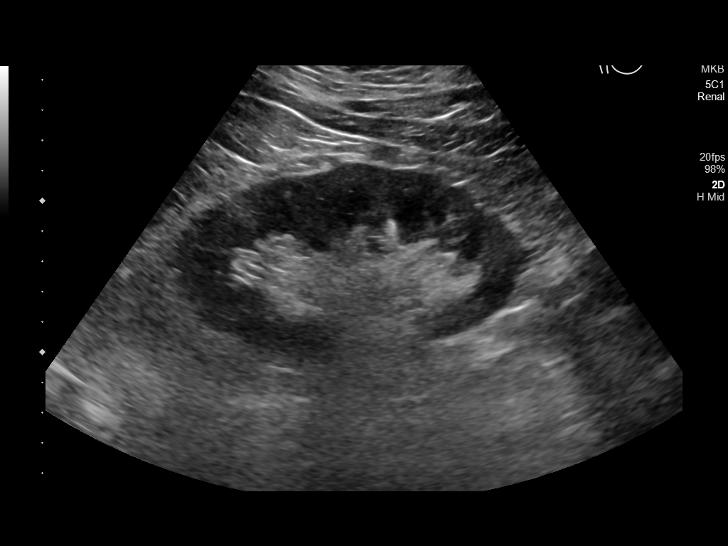
[im 19/41]
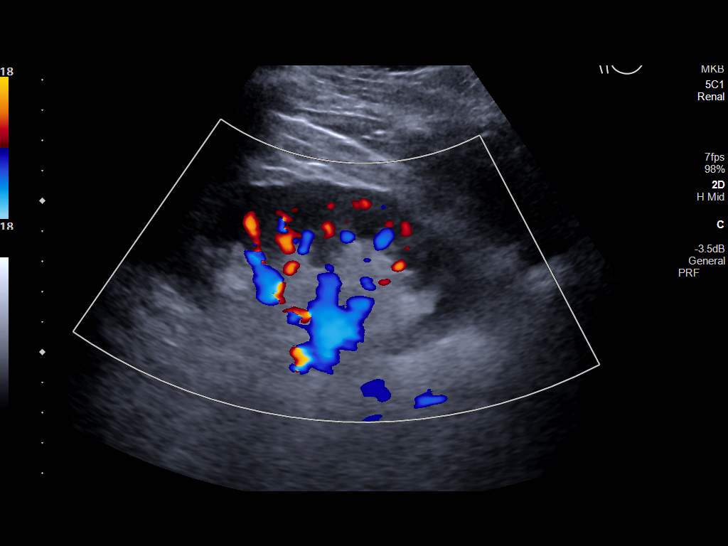
[im 22/41]
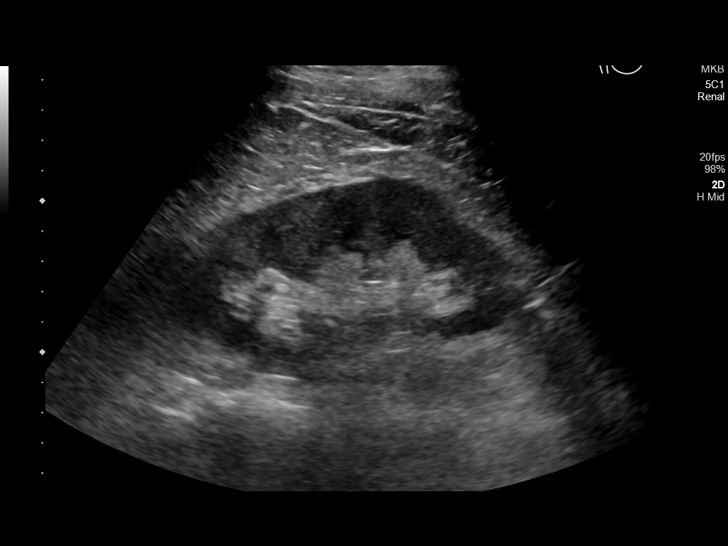
[im 26/41]
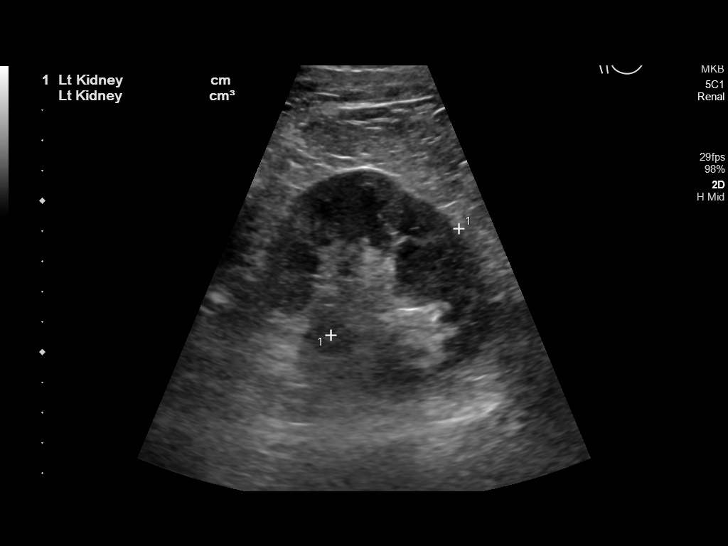
[im 27/41]
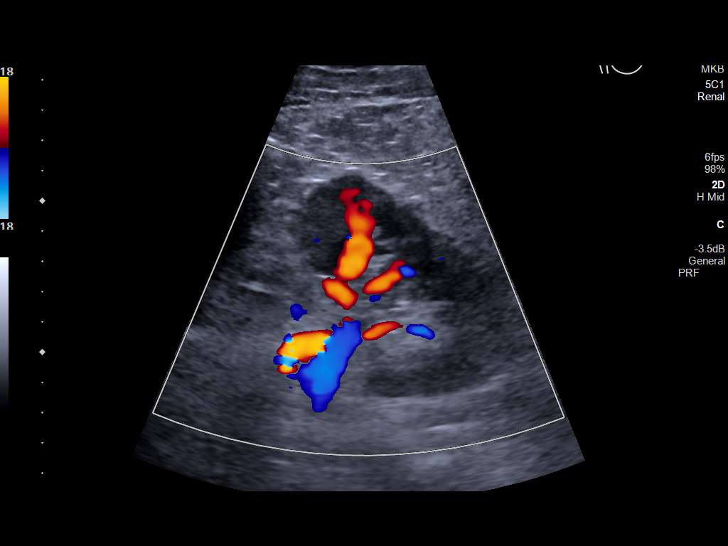
[im 31/41]
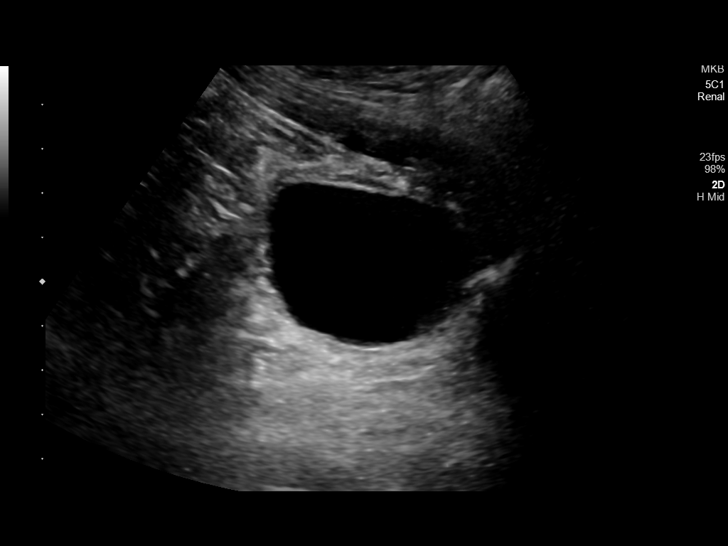
[im 34/41]
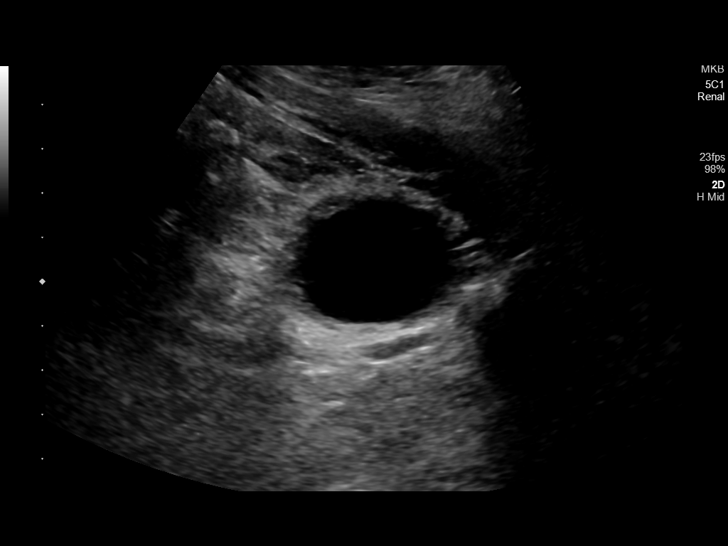
[im 37/41]
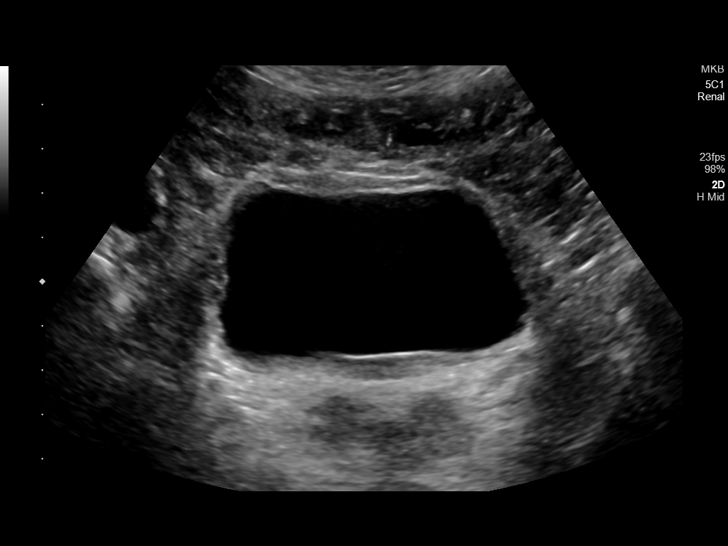
[im 41/41]
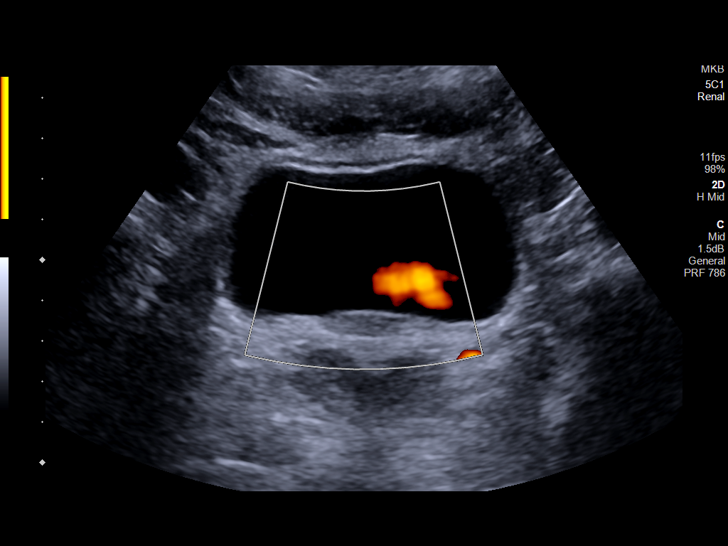

[14 of 25 positions shown; findings below may reference images not displayed]

FINDINGS: Right Kidney:

Renal measurements: 10.3 x 5.9 x 5.5 cm = volume: 177.9 mL.
Echogenicity within normal limits. No mass or hydronephrosis
visualized.

Left Kidney:

Renal measurements: 11.4 x 6.6 x 5.5 cm = volume: 216.8 mL.
Echogenicity within normal limits. No mass or hydronephrosis
visualized.

Bladder:

Slightly thick-walled appearance of the bladder.

Other:

None.
IMPRESSION: 1. Normal ultrasound appearance of the kidneys.
2. Bladder appears slightly thick-walled
